# Patient Record
Sex: Female | Born: 1945
Health system: Southern US, Community
[De-identification: ages and names within clinical notes are randomized; demographics above are authoritative.]

## PROBLEM LIST (undated history)

## (undated) DIAGNOSIS — K589 Irritable bowel syndrome without diarrhea: Secondary | ICD-10-CM

## (undated) DIAGNOSIS — K5790 Diverticulosis of intestine, part unspecified, without perforation or abscess without bleeding: Secondary | ICD-10-CM

## (undated) DIAGNOSIS — G5603 Carpal tunnel syndrome, bilateral upper limbs: Secondary | ICD-10-CM

## (undated) DIAGNOSIS — K219 Gastro-esophageal reflux disease without esophagitis: Secondary | ICD-10-CM

## (undated) DIAGNOSIS — K449 Diaphragmatic hernia without obstruction or gangrene: Secondary | ICD-10-CM

## (undated) DIAGNOSIS — K222 Esophageal obstruction: Secondary | ICD-10-CM

## (undated) DIAGNOSIS — B9681 Helicobacter pylori [H. pylori] as the cause of diseases classified elsewhere: Secondary | ICD-10-CM

## (undated) DIAGNOSIS — M5030 Other cervical disc degeneration, unspecified cervical region: Secondary | ICD-10-CM

## (undated) DIAGNOSIS — D179 Benign lipomatous neoplasm, unspecified: Secondary | ICD-10-CM

## (undated) DIAGNOSIS — K2970 Gastritis, unspecified, without bleeding: Secondary | ICD-10-CM

## (undated) DIAGNOSIS — K5710 Diverticulosis of small intestine without perforation or abscess without bleeding: Secondary | ICD-10-CM

## (undated) DIAGNOSIS — K644 Residual hemorrhoidal skin tags: Secondary | ICD-10-CM

## (undated) DIAGNOSIS — E039 Hypothyroidism, unspecified: Secondary | ICD-10-CM

## (undated) DIAGNOSIS — E785 Hyperlipidemia, unspecified: Secondary | ICD-10-CM

## (undated) DIAGNOSIS — N281 Cyst of kidney, acquired: Secondary | ICD-10-CM

## (undated) HISTORY — DX: Residual hemorrhoidal skin tags: K64.4

## (undated) HISTORY — DX: Diaphragmatic hernia without obstruction or gangrene: K44.9

## (undated) HISTORY — DX: Helicobacter pylori (H. pylori) as the cause of diseases classified elsewhere: B96.81

## (undated) HISTORY — PX: OTHER SURGICAL HISTORY: SHX169

## (undated) HISTORY — DX: Hyperlipidemia, unspecified: E78.5

## (undated) HISTORY — PX: DILATION AND CURETTAGE OF UTERUS: SHX78

## (undated) HISTORY — DX: Cyst of kidney, acquired: N28.1

## (undated) HISTORY — DX: Diverticulosis of intestine, part unspecified, without perforation or abscess without bleeding: K57.90

## (undated) HISTORY — DX: Hypothyroidism, unspecified: E03.9

## (undated) HISTORY — PX: NOSE SURGERY: SHX723

## (undated) HISTORY — PX: CARPAL TUNNEL RELEASE: SHX101

## (undated) HISTORY — DX: Irritable bowel syndrome, unspecified: K58.9

## (undated) HISTORY — DX: Esophageal obstruction: K22.2

## (undated) HISTORY — DX: Diverticulosis of small intestine without perforation or abscess without bleeding: K57.10

## (undated) HISTORY — DX: Gastritis, unspecified, without bleeding: K29.70

## (undated) HISTORY — DX: Gastro-esophageal reflux disease without esophagitis: K21.9

---
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
## 1979-11-04 HISTORY — PX: LAPAROSCOPY: SHX197

## 1998-01-29 ENCOUNTER — Other Ambulatory Visit: Admission: RE | Admit: 1998-01-29 | Discharge: 1998-01-29 | Payer: Self-pay | Admitting: Gynecology

## 1998-08-22 ENCOUNTER — Other Ambulatory Visit: Admission: RE | Admit: 1998-08-22 | Discharge: 1998-08-22 | Payer: Self-pay | Admitting: Obstetrics and Gynecology

## 1999-09-13 ENCOUNTER — Other Ambulatory Visit: Admission: RE | Admit: 1999-09-13 | Discharge: 1999-09-13 | Payer: Self-pay | Admitting: Obstetrics and Gynecology

## 1999-11-01 ENCOUNTER — Encounter: Admission: RE | Admit: 1999-11-01 | Discharge: 1999-11-01 | Payer: Self-pay | Admitting: Obstetrics and Gynecology

## 1999-11-01 ENCOUNTER — Encounter: Payer: Self-pay | Admitting: Obstetrics and Gynecology

## 2001-01-11 ENCOUNTER — Other Ambulatory Visit: Admission: RE | Admit: 2001-01-11 | Discharge: 2001-01-11 | Payer: Self-pay | Admitting: Obstetrics and Gynecology

## 2001-07-16 ENCOUNTER — Encounter: Payer: Self-pay | Admitting: Obstetrics and Gynecology

## 2001-07-16 ENCOUNTER — Encounter: Admission: RE | Admit: 2001-07-16 | Discharge: 2001-07-16 | Payer: Self-pay | Admitting: Obstetrics and Gynecology

## 2002-02-17 ENCOUNTER — Other Ambulatory Visit: Admission: RE | Admit: 2002-02-17 | Discharge: 2002-02-17 | Payer: Self-pay | Admitting: Gynecology

## 2002-10-11 ENCOUNTER — Encounter: Admission: RE | Admit: 2002-10-11 | Discharge: 2002-10-11 | Payer: Self-pay | Admitting: Obstetrics and Gynecology

## 2002-10-11 ENCOUNTER — Encounter: Payer: Self-pay | Admitting: Obstetrics and Gynecology

## 2003-05-23 ENCOUNTER — Encounter: Admission: RE | Admit: 2003-05-23 | Discharge: 2003-05-23 | Payer: Self-pay | Admitting: Endocrinology

## 2003-05-23 ENCOUNTER — Encounter: Payer: Self-pay | Admitting: Endocrinology

## 2003-07-14 ENCOUNTER — Other Ambulatory Visit: Admission: RE | Admit: 2003-07-14 | Discharge: 2003-07-14 | Payer: Self-pay | Admitting: Obstetrics and Gynecology

## 2003-08-24 ENCOUNTER — Encounter: Payer: Self-pay | Admitting: Internal Medicine

## 2003-11-04 DIAGNOSIS — K5790 Diverticulosis of intestine, part unspecified, without perforation or abscess without bleeding: Secondary | ICD-10-CM

## 2003-11-04 HISTORY — DX: Diverticulosis of intestine, part unspecified, without perforation or abscess without bleeding: K57.90

## 2003-11-16 ENCOUNTER — Encounter: Admission: RE | Admit: 2003-11-16 | Discharge: 2003-11-16 | Payer: Self-pay | Admitting: Obstetrics and Gynecology

## 2003-11-24 ENCOUNTER — Encounter: Payer: Self-pay | Admitting: Internal Medicine

## 2004-09-11 ENCOUNTER — Other Ambulatory Visit: Admission: RE | Admit: 2004-09-11 | Discharge: 2004-09-11 | Payer: Self-pay | Admitting: Obstetrics and Gynecology

## 2004-10-14 ENCOUNTER — Ambulatory Visit (HOSPITAL_COMMUNITY): Admission: RE | Admit: 2004-10-14 | Discharge: 2004-10-14 | Payer: Self-pay | Admitting: Cardiology

## 2005-01-13 ENCOUNTER — Encounter: Admission: RE | Admit: 2005-01-13 | Discharge: 2005-01-13 | Payer: Self-pay | Admitting: Obstetrics and Gynecology

## 2005-12-26 ENCOUNTER — Other Ambulatory Visit: Admission: RE | Admit: 2005-12-26 | Discharge: 2005-12-26 | Payer: Self-pay | Admitting: Obstetrics & Gynecology

## 2006-01-28 ENCOUNTER — Encounter: Admission: RE | Admit: 2006-01-28 | Discharge: 2006-01-28 | Payer: Self-pay | Admitting: Obstetrics & Gynecology

## 2007-08-12 ENCOUNTER — Encounter: Admission: RE | Admit: 2007-08-12 | Discharge: 2007-08-12 | Payer: Self-pay | Admitting: Obstetrics and Gynecology

## 2007-08-19 ENCOUNTER — Encounter: Admission: RE | Admit: 2007-08-19 | Discharge: 2007-08-19 | Payer: Self-pay | Admitting: Obstetrics and Gynecology

## 2007-10-08 ENCOUNTER — Other Ambulatory Visit: Admission: RE | Admit: 2007-10-08 | Discharge: 2007-10-08 | Payer: Self-pay | Admitting: Obstetrics and Gynecology

## 2008-08-15 ENCOUNTER — Encounter: Admission: RE | Admit: 2008-08-15 | Discharge: 2008-08-15 | Payer: Self-pay | Admitting: Obstetrics and Gynecology

## 2009-09-13 ENCOUNTER — Encounter: Admission: RE | Admit: 2009-09-13 | Discharge: 2009-09-13 | Payer: Self-pay | Admitting: Obstetrics and Gynecology

## 2010-09-02 ENCOUNTER — Encounter (INDEPENDENT_AMBULATORY_CARE_PROVIDER_SITE_OTHER): Payer: Self-pay | Admitting: *Deleted

## 2010-10-03 ENCOUNTER — Encounter: Admission: RE | Admit: 2010-10-03 | Discharge: 2010-10-03 | Payer: Self-pay | Admitting: Obstetrics and Gynecology

## 2010-10-17 ENCOUNTER — Encounter (INDEPENDENT_AMBULATORY_CARE_PROVIDER_SITE_OTHER): Payer: Self-pay

## 2010-10-22 ENCOUNTER — Ambulatory Visit: Payer: Self-pay | Admitting: Internal Medicine

## 2010-10-24 ENCOUNTER — Telehealth (INDEPENDENT_AMBULATORY_CARE_PROVIDER_SITE_OTHER): Payer: Self-pay | Admitting: *Deleted

## 2010-10-24 ENCOUNTER — Encounter: Payer: Self-pay | Admitting: Internal Medicine

## 2010-11-11 ENCOUNTER — Encounter (INDEPENDENT_AMBULATORY_CARE_PROVIDER_SITE_OTHER): Payer: Self-pay

## 2010-11-12 ENCOUNTER — Ambulatory Visit
Admission: RE | Admit: 2010-11-12 | Discharge: 2010-11-12 | Payer: Self-pay | Source: Home / Self Care | Attending: Internal Medicine | Admitting: Internal Medicine

## 2010-11-19 ENCOUNTER — Ambulatory Visit
Admission: RE | Admit: 2010-11-19 | Discharge: 2010-11-19 | Payer: Self-pay | Source: Home / Self Care | Attending: Internal Medicine | Admitting: Internal Medicine

## 2010-11-19 ENCOUNTER — Other Ambulatory Visit: Payer: Self-pay | Admitting: Internal Medicine

## 2010-11-19 LAB — HM COLONOSCOPY

## 2010-11-25 ENCOUNTER — Telehealth (INDEPENDENT_AMBULATORY_CARE_PROVIDER_SITE_OTHER): Payer: Self-pay | Admitting: *Deleted

## 2010-11-25 ENCOUNTER — Encounter: Payer: Self-pay | Admitting: Internal Medicine

## 2010-11-27 ENCOUNTER — Telehealth: Payer: Self-pay | Admitting: Internal Medicine

## 2010-12-03 NOTE — Letter (Signed)
Summary: Pre Visit Letter Revised  [facility]  [address] [place], [place] [address]   Phone: [phone]  Fax: [phone]        09/02/2010 MRN: [id number] Victoria Peters [address] [place], [place]  [address]             Procedure Date:  11/07/2009   Welcome to the Gastroenterology Division at [facility].    You are scheduled to see a nurse for your pre-procedure visit on 10/22/2010 at 8:00AM on the 3rd floor at [facility], [address]  We ask that you try to arrive at our office 15 minutes prior to your appointment time to allow for check-in.  Please take a minute to review the attached form.  If you answer "Yes" to one or more of the questions on the first page, we ask that you call the person listed at your earliest opportunity.  If you answer "No" to all of the questions, please complete the rest of the form and bring it to your appointment.    Your nurse visit will consist of discussing your medical and surgical history, your immediate family medical history, and your medications.   If you are unable to list all of your medications on the form, please bring the medication bottles to your appointment and we will list them.  We will need to be aware of both prescribed and over the counter drugs.  We will need to know exact dosage information as well.    Please be prepared to read and sign documents such as consent forms, a financial agreement, and acknowledgement forms.  If necessary, and with your consent, a friend or relative is welcome to sit-in on the nurse visit with you.  Please bring your insurance card so that we may make a copy of it.  If your insurance requires a referral to see a specialist, please bring your referral form from your primary care physician.  No co-pay is required for this nurse visit.     If you cannot keep your appointment, please call [phone] to cancel or reschedule prior to your appointment date.   This allows [place] the opportunity to schedule an appointment for another patient in need of care.    Thank you for choosing [facility] for your medical needs.  We appreciate the opportunity to care for you.  Please visit [place] at our website  to learn more about our practice.  Sincerely, The Gastroenterology Division

## 2010-12-04 ENCOUNTER — Telehealth: Payer: Self-pay | Admitting: Internal Medicine

## 2010-12-05 NOTE — Miscellaneous (Signed)
Summary: Anusol & Prilosec Rx  Clinical Lists Changes  Medications: Added new medication of ANUSOL-HC 25 MG SUPP (HYDROCORTISONE ACETATE) 1 suppository per rectum at bedtime - Signed Added new medication of PRILOSEC 20 MG CPDR (OMEPRAZOLE) 1 tablet by mouth daily - Signed Rx of ANUSOL-HC 25 MG SUPP (HYDROCORTISONE ACETATE) 1 suppository per rectum at bedtime;  #12 x 1;  Signed;  Entered by: Grier Rocher, RN;  Authorized by: Hart Carwin MD;  Method used: Electronically to [facility] [phone]*, [address] [place], [place], [place]  [address], Ph: [phone], Fax: [phone] Rx of PRILOSEC 20 MG CPDR (OMEPRAZOLE) 1 tablet by mouth daily;  #30 x 3;  Signed;  Entered by: Grier Rocher, RN;  Authorized by: Hart Carwin MD;  Method used: Electronically to [facility] [phone]*, [address], [place], [place], [place]  [address], Ph: [phone], Fax: [phone]    Prescriptions: PRILOSEC 20 MG CPDR (OMEPRAZOLE) 1 tablet by mouth daily  #30 x 3   Entered by:   Grier Rocher, RN   Authorized by:   Hart Carwin MD   Signed by:   Grier Rocher, RN on 11/19/2010   Method used:   Electronically to        [facility] [phone]* (retail)       [address]       [place], [place]  [address]       Ph: [phone]       Fax: [phone]   RxID:   [id number] ANUSOL-HC 25 MG SUPP (HYDROCORTISONE ACETATE) 1 suppository per rectum at bedtime  #12 x 1   Entered by:   Grier Rocher, RN   Authorized by:   Hart Carwin MD   Signed by:   Grier Rocher, RN on 11/19/2010   Method used:   Electronically to        [facility] [phone]* (retail)       [address]       [place], [place]  [address]       Ph: [phone]       Fax: [phone]   RxID:   [id number]

## 2010-12-05 NOTE — Letter (Signed)
Summary: [facility] Instructions  [facility]  [address] [place], [place] [address]   Phone: [phone]  Fax: [phone]       Victoria Peters    [birth year]    MRN: [id number]        Procedure Day /Date:  Tuesday 11/19/2010     Arrival Time:  2:30 pm     Procedure Time:  3:30 pm     Location of Procedure:                    _x _  [facility] (4th Floor)                        PREPARATION FOR COLONOSCOPY WITH MOVIPREP   Starting 5 days prior to your procedure Thursday 1/12 do not eat nuts, seeds, popcorn, corn, beans, peas,  salads, or any raw vegetables.  Do not take any fiber supplements (e.g. Metamucil, Citrucel, and Benefiber).  THE DAY BEFORE YOUR PROCEDURE         DATE: Monday 1/16  1.  Drink clear liquids the entire day-NO SOLID FOOD  2.  Do not drink anything colored red or purple.  Avoid juices with pulp.  No orange juice.  3.  Drink at least 64 oz. (8 glasses) of fluid/clear liquids during the day to prevent dehydration and help the prep work efficiently.  CLEAR LIQUIDS INCLUDE: Water Jello Ice Popsicles Tea (sugar ok, no milk/cream) Powdered fruit flavored drinks Coffee (sugar ok, no milk/cream) Gatorade Juice: apple, white grape, white cranberry  Lemonade Clear bullion, consomm, broth Carbonated beverages (any kind) Strained chicken noodle soup Hard Candy                             4.  In the morning, mix first dose of MoviPrep solution:    Empty 1 Pouch A and 1 Pouch B into the disposable container    Add lukewarm drinking water to the top line of the container. Mix to dissolve    Refrigerate (mixed solution should be used within 24 hrs)  5.  Begin drinking the prep at 5:00 p.m. The MoviPrep container is divided by 4 marks.   Every 15 minutes drink the solution down to the next mark (approximately 8 oz) until the full liter is complete.   6.  Follow completed prep with 16 oz of clear liquid of your choice  (Nothing red or purple).  Continue to drink clear liquids until bedtime.  7.  Before going to bed, mix second dose of MoviPrep solution:    Empty 1 Pouch A and 1 Pouch B into the disposable container    Add lukewarm drinking water to the top line of the container. Mix to dissolve    Refrigerate  THE DAY OF YOUR PROCEDURE      DATE: Tuesday 1/7  Beginning at 10:30 a.m. (5 hours before procedure):         1. Every 15 minutes, drink the solution down to the next mark (approx 8 oz) until the full liter is complete.  2. Follow completed prep with 16 oz. of clear liquid of your choice.    3. You may drink clear liquids until 1:30 pm (2 HOURS BEFORE PROCEDURE).   MEDICATION INSTRUCTIONS  Unless otherwise instructed, you should take regular prescription medications with a small sip of water   as early as possible the morning  of your procedure.         OTHER INSTRUCTIONS  You will need a responsible adult at least 66 years of age to accompany you and drive you home.   This person must remain in the waiting room during your procedure.  Wear loose fitting clothing that is easily removed.  Leave jewelry and other valuables at home.  However, you may wish to bring a book to read or  an iPod/MP3 player to listen to music as you wait for your procedure to start.  Remove all body piercing jewelry and leave at home.  Total time from sign-in until discharge is approximately 2-3 hours.  You should go home directly after your procedure and rest.  You can resume normal activities the  day after your procedure.  The day of your procedure you should not:   Drive   Make legal decisions   Operate machinery   Drink alcohol   Return to work  You will receive specific instructions about eating, activities and medications before you leave.    The above instructions have been reviewed and explained to me by   Ulis Rias RN  November 12, 2010 4:22 PM     I fully understand and  can verbalize these instructions _____________________________ Date _________

## 2010-12-05 NOTE — Procedures (Signed)
Summary: Colonoscopy   Patient Name: Victoria Peters, Victoria Peters MRN:  Procedure Procedures: Colonoscopy CPT: [phone].    with biopsy. CPT: Q5068410.  Personnel: Endoscopist: Dora L. Juanda Chance, MD.  Referred By: Meredeth Ide, MD.  Exam Location: Exam performed in Outpatient Clinic. Outpatient  Patient Consent: Procedure, Alternatives, Risks and Benefits discussed, consent obtained, from patient. Consent was obtained by the RN.  Indications Symptoms: Constipation Patient's stools are infrequent.  Increased Risk Screening: For family history of colorectal neoplasia, in  grandparent Family History of Polyps.  History  Current Medications: Patient is not currently taking Coumadin.  Pre-Exam Physical: Performed Nov 24, 2003. Cardio-pulmonary exam, Rectal exam, HEENT exam , Abdominal exam, Extremity exam, Neurological exam, Mental status exam WNL.  Exam Exam: Extent of exam reached: Cecum, extent intended: Cecum.  The cecum was identified by appendiceal orifice and IC valve. Images taken. ASA Classification: I. Tolerance: good.  Monitoring: Pulse and BP monitoring, Oximetry used. Supplemental O2 given.  Colon Prep Used Golytely for colon prep. Prep results: good.  Sedation Meds: Patient assessed and found to be appropriate for moderate (conscious) sedation. Fentanyl 50 mcg. given IV. Versed 5 mg. given IV.  Findings POLYP: Cecum, diminutive, sessile polyp. Distance from Anus 110 cm. Procedure:  hot biopsy, removed, retrieved, Polyp sent to pathology. ICD9: Colon Polyps: 211.3.  - DIVERTICULOSIS: Descending Colon to Sigmoid Colon. ICD9: Diverticulosis: 562.10. Comments: mild.   Assessment Abnormal examination, see findings above.  Diagnoses: 211.3: Colon Polyps.  562.10: Diverticulosis.   Comments: tiny polyp removed Events  Unplanned Interventions: No intervention was required.  Unplanned Events: There were no complications. Plans Medication Plan: Await pathology. Flax  seed: laxative OTC 1 prn, starting Nov 24, 2003   Patient Education: Patient given standard instructions for: Yearly hemoccult testing recommended. Patient instructed to get routine colonoscopy every 7 years.  Disposition: After procedure patient sent to recovery. After recovery patient sent home.    cc:  Meredeth Ide, MD      Adela Lank, MD  This report was created from the original endoscopy report, which was reviewed and signed by the above listed endoscopist.

## 2010-12-05 NOTE — Miscellaneous (Signed)
Summary: Lec previsit  Clinical Lists Changes  Medications: Added new medication of MOVIPREP 100 GM  SOLR (PEG-KCL-NACL-NASULF-NA ASC-C) As per prep instructions. - Signed Rx of MOVIPREP 100 GM  SOLR (PEG-KCL-NACL-NASULF-NA ASC-C) As per prep instructions.;  #1 x 0;  Signed;  Entered by: Ulis Rias RN;  Authorized by: Hart Carwin MD;  Method used: Electronically to [facility] [phone]*, [address] [place], [place], [place]  [address], Ph: [phone], Fax: [phone] Observations: Added new observation of ALLERGY REV: Done (11/12/2010 15:56)    Prescriptions: MOVIPREP 100 GM  SOLR (PEG-KCL-NACL-NASULF-NA ASC-C) As per prep instructions.  #1 x 0   Entered by:   Ulis Rias RN   Authorized by:   Hart Carwin MD   Signed by:   Ulis Rias RN on 11/12/2010   Method used:   Electronically to        [facility] [phone]* (retail)       [address]       [place], [place]  [address]       Ph: [phone]       Fax: [phone]   RxID:   [id number]

## 2010-12-05 NOTE — Procedures (Addendum)
Summary: Colonoscopy  Patient: [name] Note: All result statuses are Final unless otherwise noted.  Tests: (1) Colonoscopy (COL)   COL Colonoscopy           DONE     [facility]     [address]     [place], [place]  [address]           COLONOSCOPY PROCEDURE REPORT           PATIENT:  Victoria, Peters  MR#:  [id number]     BIRTHDATE:  24-Jun-1946, 64 yrs. old  GENDER:  female     ENDOSCOPIST:  Hedwig Morton. Juanda Chance, MD     REF. BY:  Adela Lank, M.D.     PROCEDURE DATE:  11/19/2010     PROCEDURE:  Colonoscopy 13086     ASA CLASS:  Class II     INDICATIONS:  family history of colon cancer, history of polyps     last colon 2005     MEDICATIONS:   There was residual sedation effect present from     prior procedure. 4 mg, Fentanyl 50 mcg           DESCRIPTION OF PROCEDURE:   After the risks benefits and     alternatives of the procedure were thoroughly explained, informed     consent was obtained.  Digital rectal exam was performed and     revealed no rectal masses.   The [id number] endoscope was     introduced through the anus and advanced to the cecum, which was     identified by both the appendix and ileocecal valve, without     limitations.  The quality of the prep was good, using MoviPrep.     The instrument was then slowly withdrawn as the colon was fully     examined.     <<PROCEDUREIMAGES>>           FINDINGS:  Internal and external hemorrhoids were found (see     image10, image9, and image7).  Mild diverticulosis was found (see     image5).  This was otherwise a normal examination of the colon     (see image3, image2, and image1).   Retroflexed views in the     rectum revealed no abnormalities.    The scope was then withdrawn     from the patient and the procedure completed.           COMPLICATIONS:  None     ENDOSCOPIC IMPRESSION:     1) Internal and external hemorrhoids     2) Mild diverticulosis     3) Otherwise normal examination  RECOMMENDATIONS:     1) high fiber diet     AnusolHC supp 1 qhs, 1 refill, #12/box     REPEAT EXAM:  In 10 year(s) for.           ______________________________     Hedwig Morton. Juanda Chance, MD           CC:  Adela Lank, M.D.           n.     eSIGNED:   Hedwig Morton. Ashton Belote at 11/19/2010 04:38 PM           [place], [place], [id number]  Note: An exclamation mark (!) indicates a result that was not dispersed into the flowsheet. Document Creation Date: 11/19/2010 4:39 PM _______________________________________________________________________  (1) Order result status: Final Collection or observation date-time: 11/19/2010 16:23 Requested date-time:  Receipt date-time:  Reported date-time:  Referring Physician:   Ordering Physician: [name] [phone]) Specimen Source:  Source: Launa Grill Order Number: [id number] Lab site:   Appended Document: Colonoscopy    Clinical Lists Changes  Observations: Added new observation of COLONNXTDUE: 11/2020 (11/19/2010 17:11)

## 2010-12-05 NOTE — Letter (Signed)
Summary: Moviprep Instructions  [facility]  [address]   [place], [place] [address]   Phone: [phone]  Fax: [phone]       Victoria Peters    [birth year]    MRN: [id number]        Procedure Day Dorna Bloom: Thursday, [birth year]     Arrival Time: 1:00 p.m.     Procedure Time: 2:00 p.m.     Location of Procedure:                    x   [facility] (4th Floor)  PREPARATION FOR COLONOSCOPY WITH MOVIPREP   Starting 5 days prior to your procedure 11-02-10 do not eat nuts, seeds, popcorn, corn, beans, peas,  salads, or any raw vegetables.  Do not take any fiber supplements (e.g. Metamucil, Citrucel, and Benefiber).  THE DAY BEFORE YOUR PROCEDURE         DATE: 11-06-10  DAY: Wednesday  1.  Drink clear liquids the entire day-NO SOLID FOOD  2.  Do not drink anything colored red or purple.  Avoid juices with pulp.  No orange juice.  3.  Drink at least 64 oz. (8 glasses) of fluid/clear liquids during the day to prevent dehydration and help the prep work efficiently.  CLEAR LIQUIDS INCLUDE: Water Jello Ice Popsicles Tea (sugar ok, no milk/cream) Powdered fruit flavored drinks Coffee (sugar ok, no milk/cream) Gatorade Juice: apple, white grape, white cranberry  Lemonade Clear bullion, consomm, broth Carbonated beverages (any kind) Strained chicken noodle soup Hard Candy                             4.  In the morning, mix first dose of MoviPrep solution:    Empty 1 Pouch A and 1 Pouch B into the disposable container    Add lukewarm drinking water to the top line of the container. Mix to dissolve    Refrigerate (mixed solution should be used within 24 hrs)  5.  Begin drinking the prep at 5:00 p.m. The MoviPrep container is divided by 4 marks.   Every 15 minutes drink the solution down to the next mark (approximately 8 oz) until the full liter is complete.   6.  Follow completed prep with 16 oz of clear liquid of your choice (Nothing red or purple).   Continue to drink clear liquids until bedtime.  7.  Before going to bed, mix second dose of MoviPrep solution:    Empty 1 Pouch A and 1 Pouch B into the disposable container    Add lukewarm drinking water to the top line of the container. Mix to dissolve    Refrigerate  THE DAY OF YOUR PROCEDURE      DATE: 11-07-10  DAY: Thursday  Beginning at 9:00 a.m. (5 hours before procedure):         1. Every 15 minutes, drink the solution down to the next mark (approx 8 oz) until the full liter is complete.  2. Follow completed prep with 16 oz. of clear liquid of your choice.    3. You may drink clear liquids until 12:00 p.m. (2 HOURS BEFORE PROCEDURE).   MEDICATION INSTRUCTIONS  Unless otherwise instructed, you should take regular prescription medications with a small sip of water   as early as possible the morning of your procedure.       OTHER INSTRUCTIONS  You will need a responsible adult at least 66 years  of age to accompany you and drive you home.   This person must remain in the waiting room during your procedure.  Wear loose fitting clothing that is easily removed.  Leave jewelry and other valuables at home.  However, you may wish to bring a book to read or  an iPod/MP3 player to listen to music as you wait for your procedure to start.  Remove all body piercing jewelry and leave at home.  Total time from sign-in until discharge is approximately 2-3 hours.  You should go home directly after your procedure and rest.  You can resume normal activities the  day after your procedure.  The day of your procedure you should not:   Drive   Make legal decisions   Operate machinery   Drink alcohol   Return to work  You will receive specific instructions about eating, activities and medications before you leave.    The above instructions have been reviewed and explained to me by   Ulis Rias RN  October 22, 2010 8:16 AM     I fully understand and can verbalize  these instructions _____________________________ Date _________

## 2010-12-05 NOTE — Miscellaneous (Signed)
Summary: REC COL...AS.  Clinical Lists Changes  Medications: Added new medication of MOVIPREP 100 GM  SOLR (PEG-KCL-NACL-NASULF-NA ASC-C) As per prep instructions. - Signed Rx of MOVIPREP 100 GM  SOLR (PEG-KCL-NACL-NASULF-NA ASC-C) As per prep instructions.;  #1 x 0;  Signed;  Entered by: Ulis Rias RN;  Authorized by: Hart Carwin MD;  Method used: Electronically to [facility] [phone]*, [address], [place], [place], [place]  [address], Ph: [phone], Fax: [phone] Observations: Added new observation of NKA: T (10/22/2010 7:50)    Prescriptions: MOVIPREP 100 GM  SOLR (PEG-KCL-NACL-NASULF-NA ASC-C) As per prep instructions.  #1 x 0   Entered by:   Ulis Rias RN   Authorized by:   Hart Carwin MD   Signed by:   Ulis Rias RN on 10/22/2010   Method used:   Electronically to        [facility] [phone]* (retail)       [address]       [place], [place]  [address]       Ph: [phone]       Fax: [phone]   RxID:   [id number]   Appended Document: REC COL...AS. Dr Juanda Chance, The patient came in today for her previsit prior to her colonoscopy scheduled for 11/07/10 at 2pm. She has a history of a hiatal hernia and reflux and wanted to know if she needed a endoscopy at the same time. Her last endoscopy was 11/24/2003 and results were positive for H- Pylori. Please advise.  Appended Document: REC COL...AS. OK to schedule for EGD pending review of her chart. Please preload her paper chart into EMR.

## 2010-12-05 NOTE — Procedures (Signed)
Summary: EGD   Patient Name: Victoria Peters, Victoria Peters MRN:  Procedure Procedures: Panendoscopy (EGD) CPT: 43235.    with biopsy(s)/brushing(s). CPT: D1846139.    with esophageal dilation. CPT: G9296129.  Personnel: Endoscopist: Dora L. Juanda Chance, MD.  Referred By: Meredeth Ide, MD.  Exam Location: Exam performed in Outpatient Clinic. Outpatient  Patient Consent: Procedure, Alternatives, Risks and Benefits discussed, consent obtained, from patient. Consent was obtained by the RN.  Indications Symptoms: Dyspepsia, location: epigastric. Reflux symptoms  History  Current Medications: Patient is not currently taking Coumadin.  Pre-Exam Physical: Performed Nov 24, 2003  Cardio-pulmonary exam, HEENT exam, Abdominal exam, Extremity exam, Neurological exam, Mental status exam WNL.  Exam Exam Info: Maximum depth of insertion Duodenum, intended Duodenum. Vocal cords visualized. Gastric retroflexion performed. Images taken. ASA Classification: I. Tolerance: good.  Sedation Meds: Patient assessed and found to be appropriate for moderate (conscious) sedation. Fentanyl 25 mcg. given IV. Versed 2 mg. given IV. Cetacaine Spray 2 sprays given aerosolized.  Monitoring: BP and pulse monitoring done. Oximetry used. Supplemental O2 given  Findings - OTHER FINDING: mild narrow ing of the g-e junction in Distal Esophagus. Biopsy/Other Finding taken.  Dilation: Distal Esophagus. Maloney dilator used, Diameter: 48 mm, Minimal Resistance, Minimal Heme present on extraction. 1  total dilators used. Patient tolerance fair. Outcome: successful.  - MUCOSAL ABNORMALITY: Body. Erosions present. Hemorrhage (oozing). Erythematous mucosa. RUT done, results pending. ICD9: Gastritis, Acute: 535.00. Comment: bike reflux.   Assessment Abnormal examination, see findings above.  Diagnoses: 535.00: Gastritis, Acute.   Comments: s/p passage of 97F Maloney dilator  thru a narow g-e junction [no definite  stricture] Events  Unplanned Intervention: No unplanned interventions were required.  Unplanned Events: There were no complications. Plans Medication(s): Await pathology. Promotility: Metaclopramide 10 mg HS, starting Nov 24, 2003  H2Blocker: Pepcid/Famotidine 40 mg prn, starting Nov 24, 2003   Patient Education: Patient given standard instructions for: Reflux.  Comments: colonoscopy Disposition: After procedure patient sent to recovery. After recovery patient sent home.    cc: Meredeth Ide, MD     Marny Lowenstein, MD  This report was created from the original endoscopy report, which was reviewed and signed by the above listed endoscopist.

## 2010-12-05 NOTE — Letter (Signed)
Summary: Patient [facility] Biopsy Results  [facility]  [address] [place], [place] [address]   Phone: [phone]  Fax: [phone]        November 25, 2010 MRN: [id number]    [facility] [address] [place], [place]  [address]    Dear Ms. Saephanh,  I am pleased to inform you that the biopsies taken during your recent endoscopic examination did not show any evidence of cancer upon pathologic examination.The biopsies show H.Pylori gastritis, which needs to be tyreated with antibiotics.  Additional information/recommendations:  __No further action is needed at this time.  Please follow-up with      your primary care physician for your other healthcare needs.  MY OFFICE WILL CALL YOU IN YOUR MEDICATIONS FOR H.PYLORI. PLEASE PICK THEM UP.  __ Continue with the treatment plan as outlined on the day of your      exam.    Please call us if you are having persistent problems or have questions about your condition that have not been fully answered at this time.  Sincerely,  Hart Carwin MD  This letter has been electronically signed by your physician.  Appended Document: Patient Notice-Endo Biopsy Results LETTER MAILED

## 2010-12-05 NOTE — Procedures (Addendum)
Summary: Upper Endoscopy  Patient: Tanaka Gillen Note: All result statuses are Final unless otherwise noted.  Tests: (1) Upper Endoscopy (EGD)   EGD Upper Endoscopy       DONE (C)     [facility]     [address]     [place], [place]  [address]           ENDOSCOPY PROCEDURE REPORT           PATIENT:  Brooke, Steinhilber  MR#:  [id number]     BIRTHDATE:  02/22/1946, 64 yrs. old  GENDER:  female           ENDOSCOPIST:  Hedwig Morton. Juanda Chance, MD     Referred by:  Adela Lank, M.D.           PROCEDURE DATE:  11/19/2010     PROCEDURE:  EGD with biopsy, [address]     ASA CLASS:  Class II     INDICATIONS:  abdominal pain hx of H.Pylori, in 11/2003, treated     but pt does not recall being treated           MEDICATIONS:   Fentanyl 50 mcg, Versed 5 mg IV     TOPICAL ANESTHETIC:  Exactacain Spray           DESCRIPTION OF PROCEDURE:   After the risks benefits and     alternatives of the procedure were thoroughly explained, informed     consent was obtained.  The LB GIF-H180 [id number] endoscope was     introduced through the mouth and advanced to the second portion of     the duodenum, without limitations.  The instrument was slowly     withdrawn as the mucosa was fully examined.     <<PROCEDUREIMAGES>>           Esophagitis was found in the distal esophagus. several <5 mm     erosions With standard forceps, a biopsy was obtained and sent to     pathology.  A hiatal hernia was found (see image1, image3, image8,     and image11). 2 cm hh  Bile reflux was found (see image5).  Mild     gastritis was found. With standard forceps, a biopsy was obtained     and sent to pathology. r/o H (see image4, image7, image9, and     image10).pylori  Otherwise normal stomach (see image6).     Retroflexed views revealed no abnormalities.    The scope was then     withdrawn from the patient and the procedure completed.           COMPLICATIONS:  None           ENDOSCOPIC IMPRESSION:     1) Esophagitis in  the distal esophagus     2) Hiatal hernia     3) Bile reflux     4) Mild gastritis     5) Otherwise normal stomach     RECOMMENDATIONS:     1) Await biopsy results     Prelosec 20 mg, #30, 1 po qd, 3 refills           REPEAT EXAM:  In 0 year(s) for.           ______________________________     Hedwig Morton. Juanda Chance, MD           CC:  Adela Lank, M.D.           n.     REVISED:  11/28/2010 08:27 AM     eSIGNED:   Hedwig Morton. Tamyra Fojtik at 11/28/2010 08:27 AM           Erlene, Devita, [id number]  Note: An exclamation mark (!) indicates a result that was not dispersed into the flowsheet. Document Creation Date: 11/28/2010 8:27 AM _______________________________________________________________________  (1) Order result status: Final Collection or observation date-time: 11/19/2010 16:09 Requested date-time:  Receipt date-time:  Reported date-time:  Referring Physician:   Ordering Physician: [name] [phone]) Specimen Source:  Source: Launa Grill Order Number: [id number] Lab site:

## 2010-12-05 NOTE — Letter (Signed)
Summary: Pre Visit Letter Revised  [facility]  [address] [place], [place] [address]   Phone: [phone]  Fax: [phone]        10/24/2010 MRN: [id number] [facility] [address] [place], [place]  [address]             Procedure Date: 11/19/2010  Welcome to the Gastroenterology Division at [facility].    You are scheduled to see a nurse for your pre-procedure visit on 11/12/2010 at 4 PM on the 3rd floor at [facility], [address]  We ask that you try to arrive at our office 15 minutes prior to your appointment time to allow for check-in.  Please take a minute to review the attached form.  If you answer "Yes" to one or more of the questions on the first page, we ask that you call the person listed at your earliest opportunity.  If you answer "No" to all of the questions, please complete the rest of the form and bring it to your appointment.    Your nurse visit will consist of discussing your medical and surgical history, your immediate family medical history, and your medications.   If you are unable to list all of your medications on the form, please bring the medication bottles to your appointment and we will list them.  We will need to be aware of both prescribed and over the counter drugs.  We will need to know exact dosage information as well.    Please be prepared to read and sign documents such as consent forms, a financial agreement, and acknowledgement forms.  If necessary, and with your consent, a friend or relative is welcome to sit-in on the nurse visit with you.  Please bring your insurance card so that we may make a copy of it.  If your insurance requires a referral to see a specialist, please bring your referral form from your primary care physician.  No co-pay is required for this nurse visit.     If you cannot keep your appointment, please call [phone] to cancel or reschedule prior to your appointment date.  This  allows [place] the opportunity to schedule an appointment for another patient in need of care.    Thank you for choosing [facility] for your medical needs.  We appreciate the opportunity to care for you.  Please visit [place] at our website  to learn more about our practice.  Sincerely, The Gastroenterology Division

## 2010-12-05 NOTE — Progress Notes (Signed)
Summary: Change from Prevpak to another Medication  Phone Note Outgoing Call   Summary of Call: ---- 11/27/2010 10:48 AM, Lamona Curl CMA (AAMA) wrote: insurance will not cover prevpak....would you like me to send amoxicillin 1000 mg two times a day, clarithromycin 500 two times a day and ppi bid x 10 days instead?  ---- 11/27/2010 11:09 AM, Hart Carwin MD wrote: yes, please, thank You.  Follow-up for Phone Call        I have left a message for the patient to call back. Dottie Nelson-Smith CMA Duncan Dull)  November 27, 2010 11:18 AM   I have spoken with patient and advised her that Juanda Crumble is not covered by her insurance. Advised her we have sent Biaxin and Amoxicillin in its place. I have confirmed that she is not allergic to any of these medications. I have given her specific instructions as to how to take medication and have asked her to double up on her prilosec (take two times a day dosing) x 10 days while she is taking the antibiotics. I have advised her that she should let us know if she cannot tolerate the medication or if her symptoms persist after she has taken the course. She verabalizes understanding. Follow-up by: Lamona Curl CMA Duncan Dull),  November 28, 2010 8:34 AM    New/Updated Medications: AMOXICILLIN 500 MG CAPS (AMOXICILLIN) Take 2 capsules by mouth two times a day x 10 days (PLEASE D/C PREVPAK RX) BIAXIN 500 MG TABS (CLARITHROMYCIN) Take 1 tablet by mouth two times a day x 10 days (PLEASE D/C PREVPAK RX) Prescriptions: BIAXIN 500 MG TABS (CLARITHROMYCIN) Take 1 tablet by mouth two times a day x 10 days (PLEASE D/C PREVPAK RX)  #20 x 0   Entered by:   Lamona Curl CMA (AAMA)   Authorized by:   Hart Carwin MD   Signed by:   Lamona Curl CMA (AAMA) on 11/27/2010   Method used:   Electronically to        [facility] [phone]* (retail)       [address]       [place], [place]  [address]       Ph: [phone]      Fax: [phone]   RxID:   [id number] AMOXICILLIN 500 MG CAPS (AMOXICILLIN) Take 2 capsules by mouth two times a day x 10 days (PLEASE D/C PREVPAK RX)  #40 x 0   Entered by:   Lamona Curl CMA (AAMA)   Authorized by:   Hart Carwin MD   Signed by:   Lamona Curl CMA (AAMA) on 11/27/2010   Method used:   Electronically to        [facility] [phone]* (retail)       [address]       [place], [place]  [address]       Ph: [phone]       Fax: [phone]   RxID:   [id number]

## 2010-12-05 NOTE — Progress Notes (Signed)
----   Converted from flag ---- ---- 11/25/2010 12:50 PM, Hart Carwin MD wrote: Victoria Peters, Mrs Demby has H.Pylori gastritis. Please send her Prevpak.Thanx ------------------------------       Additional Follow-up for Phone Call Additional follow up Details #2::    Lmom for patient to call back- want to verify her pharmacy. Graciella Freer RN  November 25, 2010 2:57 PM  Message left for patient to call back again. Jesse Fall RN  November 26, 2010 9:51 AM Explained to patient H. pylori dx and need for medications. Verified pharmacy and sent rx to her pharmacy. Follow-up by: Jesse Fall RN,  November 26, 2010 4:17 PM  New/Updated Medications: PREVPAC  MISC (AMOXICILL-CLARITHRO-LANSOPRAZ) As directed Prescriptions: PREVPAC  MISC (AMOXICILL-CLARITHRO-LANSOPRAZ) As directed  #1 pack x 0   Entered by:   Jesse Fall RN   Authorized by:   Hart Carwin MD   Signed by:   Jesse Fall RN on 11/26/2010   Method used:   Electronically to        [facility] [phone]* (retail)       [address]       [place], [place]  [address]       Ph: [phone]       Fax: [phone]   RxID:   [id number]

## 2010-12-05 NOTE — Progress Notes (Signed)
Summary: Schedule ECL.  ---- Converted from flag ---- ---- 10/23/2010 10:13 PM, Hart Carwin MD wrote: Is this the pt  who asked if EGD  needed at the time of colonoscopy on 11/07/2010? Only if she is having any symptoms. If not, then she does not need an EGD.  ---- 10/23/2010 11:39 AM, Jesse Fall RN wrote: Paper chart in your office. Regina ------------------------------  Spoke with patient. She is having reflux and esophageal burning. She is taking OTC Equate Acid Reducer without relief. Rescheduled patient for 11/19/2010 for ECL.  Phone Note Outgoing Call Call back at [facility] Phone [phone]   Call placed by: Jesse Fall RN,  October 24, 2010 1:11 PM

## 2010-12-05 NOTE — Progress Notes (Signed)
----   Converted from flag ---- ---- 11/25/2010 12:50 PM, Hart Carwin MD wrote: Dagoberto Reef, Mrs Fabio has H.Pylori gastritis. Please send her Prevpak.Thanx ------------------------------

## 2010-12-06 ENCOUNTER — Telehealth: Payer: Self-pay | Admitting: Internal Medicine

## 2010-12-11 NOTE — Progress Notes (Signed)
Summary: triage  Phone Note Call from Patient Call back at [phone]   Caller: Patient Call For: dR. Rasheda Ledger Reason for Call: Talk to Nurse Summary of Call: Pt was put on medication by Dr. Juanda Chance that she thinks she is having an allergic reaction to and would like to speak with nurse Initial call taken by: [place] Johnson,  December 04, 2010 8:19 AM  Follow-up for Phone Call        Spoke with patient. She started Biaxin 500 mg two times a day, Amoxicillin 500 mg two times a day and Prilosec two times a day for 10 days for H. Pylori on 11/28/10. Last night, her stomach started hurting and at 3 AM she woke up sweating, feeling faint, stomach bloated, nausea and diarrhea. Now, she has nausea and her stomach feels bloated. Denies diarrhea or fever at this time. She does not think she can take the medication and not vomit. She is not sure if she is having a reaction from the medication or if she has a stomach virus. Please, advise. Follow-up by: Jesse Fall RN,  December 04, 2010 9:00 AM  Additional Follow-up for Phone Call Additional follow up Details #1::        Could be infection or more likely side effects from Biaxain or less likely Amoxicillin. She needs to stop them for now and then will need a  call back next week  to see how she is and eventually Dr. Juanda Chance will need to decide how to treat the H. pylori Liquid to BRAT diet for now, advancing as tolerated Additional Follow-up by: Iva Boop MD, Clementeen Graham,  December 04, 2010 12:00 PM    Additional Follow-up for Phone Call Additional follow up Details #2::    Patient notified of Dr. Marvell Fuller recommendations.  She will call back is she does not get to feeling better.Flag sent to me to check on patient next week. Follow-up by: Jesse Fall RN,  December 04, 2010 1:25 PM  Additional Follow-up for Phone Call Additional follow up Details #3:: Details for Additional Follow-up Action Taken: reviwed and agree with Dr Leone Payor. I will discuss  when I return. Additional Follow-up by: Hart Carwin MD,  December 04, 2010 3:04 PM

## 2010-12-16 ENCOUNTER — Ambulatory Visit (INDEPENDENT_AMBULATORY_CARE_PROVIDER_SITE_OTHER): Payer: Medicare Other | Admitting: Internal Medicine

## 2010-12-16 ENCOUNTER — Encounter: Payer: Self-pay | Admitting: Internal Medicine

## 2010-12-16 DIAGNOSIS — K2900 Acute gastritis without bleeding: Secondary | ICD-10-CM

## 2010-12-19 NOTE — Progress Notes (Signed)
Summary: Triage  Phone Note Call from Patient Call back at [phone]   Caller: Patient Call For: Dr. Juanda Chance Reason for Call: Talk to Nurse Summary of Call: Patient would like to follow up with nurse Initial call taken by: [place] Johnson,  December 06, 2010 4:31 PM  Follow-up for Phone Call        Spoke with patient. She is feeling bloated after eating a little bit and has some nausea. She states she thinks it is because she is not having good bowel movements and wants to know what to take. Suggested she try Miralax one dose tonight to see if this will help. Follow-up by: Jesse Fall RN,  December 06, 2010 4:49 PM  Additional Follow-up for Phone Call Additional follow up Details #1::        I agree, may continue 1/2 capfull daily. Additional Follow-up by: Hart Carwin MD,  December 06, 2010 9:42 PM    Additional Follow-up for Phone Call Additional follow up Details #2::    Patient given Dr. Regino Schultze recommendation. She states she is feeling much better today. She is wondering what to do now about  H. pylori treatment. Please, advise. Follow-up by: Jesse Fall RN,  December 09, 2010 9:42 AM  Additional Follow-up for Phone Call Additional follow up Details #3:: Details for Additional Follow-up Action Taken: If she finished it, then nothing has to be done. If more issues, make appointment Additional Follow-up by: Hart Carwin MD,  December 09, 2010 10:26 PM   Appended Document: Triage Scheduled patient an appointment on 12/16/10 at 10:45 AM with Dr. Juanda Chance. Message left for patient to call back. Jesse Fall, RN 12/10/10 10:43 AM  Appended Document: Triage Patient called back. Gave her the appointment date and time.

## 2010-12-19 NOTE — Assessment & Plan Note (Signed)
Summary: Gastroenterology  [place]  MR#:  [id number] Page #  NAME:  Victoria Peters, Victoria Peters   OFFICE NO:  [id number]  DATE:  08/24/03  DOB:  July 29, 2046  NEW PATIENT EVALUATION   The patient is a very nice 65 year old white female patient of Dr. Harlene Salts who is referred for a screening colonoscopy.  She has a history of a hiatal hernia and gastroesophageal reflux, which is currently well controlled on metoclopramide 10 mg at bedtime.  He has not taken any acid-suppressing agents.  She denies any dysphagia.  Her lower gastrointestinal symptoms are characterized by constipation.  She has tried various over-the-counter preparations, mostly simply flax seed she takes in granular form on a daily basis.  She now is having regular daily bowel movements.  She denies rectal bleeding.  There is a question of a family history of colon cancer in her mother, diagnosis was never confirmed.  Her brother, at the age of 32, had colon polyps and had colonoscopy.  She had a flexible sigmoidoscopy in April of 1990 by me which was a normal exam.  She has never had a colonoscopy.    ALLERGIES:  None.    MEDICATIONS:  Lescol XL 80 mg p.o. daily, metoclopramide 10 mg p.o. nightly, Synthroid 0.075 mg p.o. daily.  PAST ILLNESSES:  Hyperlipidemia, thyroid trouble.  OPERATIONS:  Laparoscopy, no surgery.    FAMILY HISTORY:  Positive for heart disease in brother and colon cancer as in the present illness.    SOCIAL HISTORY:  She does not smoke and does not drink.  She stopped smoking 34 years ago.  She is married and has children.    REVIEW OF SYSTEMS:  Positive of allergies and arthritic complaints.    PHYSICAL EXAMINATION:  Blood pressure 132/74, pulse 72, and weight 142 pounds.  She was alert, oriented, and in no distress.  Skin was warm and dry.   Sclerae were nonicteric.  Neck was supple with no adenopathy.  Lungs were clear to auscultation.  COR: With normal S1 and normal S2.  Abdomen was soft and nontender with normoactive bowel  sounds.  No particular tenderness.  No distention.  Rectal exam:  Hemoccult-negative stool.     IMPRESSION:   66.  A 65 year old white female with gastroesophageal reflux disease which is controlled on metoclopramide.  She denies any symptoms suggestive of esophageal stricture.   2.  Positive family history of colon polyps with question of family history of colon cancer.  The patient is a good candidate for screening colonoscopy.    PLAN:   Screening colonoscopy has been scheduled for October 06, 2003, at 10:00 a.m. in our office.  She also has requested upper endoscopy because of chronic gastroesophageal reflux.  I agree because she is dependent on medications to control her reflux.  We need to rule out Barrett's esophagus.          Hedwig Morton. Juanda Chance, M.D.  [id number] cc:  Dr. Tomasa Hose  D:  08/24/03; T:  ; Job [phone]

## 2010-12-25 NOTE — Assessment & Plan Note (Signed)
Summary: F/u appointment    History of Present Illness Visit Type: Follow-up Visit Primary GI MD: [name] MD Primary Provider: Michiel Sites, MD  Requesting Provider: na Chief Complaint: F/u from bloating and nausea. Pt states nausea is much better and bloating is getting better  History of Present Illness:   This is a 65 year old white female s/p  recent upper endoscopy and colonoscopy with findings of internal hemorrhoids and H. pylori gastritis. She was unable to tolerate the H. pylori regimen of Amoxicillin and Biaxin and took only 6 days worth.  She is feeling very well, having no symptoms. She has a history of gastroesophageal reflux controlled on Prilosec 20 mg daily.   GI Review of Systems      Denies abdominal pain, acid reflux, belching, bloating, chest pain, dysphagia with liquids, dysphagia with solids, heartburn, loss of appetite, nausea, vomiting, vomiting blood, weight loss, and  weight gain.        Denies anal fissure, black tarry stools, change in bowel habit, constipation, diarrhea, diverticulosis, fecal incontinence, heme positive stool, hemorrhoids, irritable bowel syndrome, jaundice, light color stool, liver problems, rectal bleeding, and  rectal pain.    Current Medications (verified): 1)  Anusol-Hc 25 Mg Supp (Hydrocortisone Acetate) .... As Needed 2)  Prilosec 20 Mg Cpdr (Omeprazole) .Marland Kitchen.. 1 Tablet By Mouth Daily 3)  Miralax  Powd (Polyethylene Glycol 3350) .... As Directed , As Needed  Allergies (verified): 1)  ! Codeine  Past History:  Past Medical History: Colon Polyps- benign colonic mucosa 11/24/03 Diverticulosis Esophageal Stricture GERD Family hx of colon polyps IBS  Past Surgical History: Reviewed history from 10/23/2010 and no changes required. Nasal surgery D & C x 2 Laparoscopy, no surgery  Family History: Reviewed history from 12/11/2010 and no changes required. Family History of Colon Polyps: Brother Family History of Colon  Cancer: ???Grandmother Family History of Heart Disease: Brother  Social History: Retired Patient is a former smoker.-stopped over 45 years ago Illicit Drug Use - no  Review of Systems       The patient complains of allergy/sinus.  The patient denies anemia, anxiety-new, arthritis/joint pain, back pain, blood in urine, breast changes/lumps, change in vision, confusion, cough, coughing up blood, depression-new, fainting, fatigue, fever, headaches-new, hearing problems, heart murmur, heart rhythm changes, itching, menstrual pain, muscle pains/cramps, night sweats, nosebleeds, pregnancy symptoms, shortness of breath, skin rash, sleeping problems, sore throat, swelling of feet/legs, swollen lymph glands, thirst - excessive , urination - excessive , urination changes/pain, urine leakage, vision changes, and voice change.         .rod   Vital Signs:  Patient profile:   65 year old female Height:      62 inches Weight:      143 pounds BMI:     26.25 BSA:     1.66 Pulse rate:   74 / minute Pulse rhythm:   regular BP sitting:   120 / 64  (left arm) Cuff size:   regular  Vitals Entered By: Ok Anis CMA (December 16, 2010 10:07 AM)   Impression & Recommendations:  Problem # 1:  GASTRITIS, ACUTE (ICD-535.00) Patient had H Pylori gastritis on upper endoscopy. Patient took only 6 days of the H Pylori regimen. She is not interested in a [employer]  breath test or repeat upper endoscopy. Her symptoms of gastroesophageal reflux are controlled on Prilosec 20 mg daily. She will notify us if she develops other digestive problems.  Problem # 2:  SCREENING, COLON  CANCER (ICD-V76.51) Patient is status post recent colonoscopy with findings of internal hemorrhoids. A recall colonoscopy will be due in 10 years, in January 2022.  Patient Instructions: 1)  Please pick up your prescriptions at the pharmacy. Electronic prescription(s) has already been sent for Prilosec 20 mg. You should take 1 tablet by  mouth once daily. We have sent you a 90 day supply. 2)  A recall colonoscopy will be due in January 2022. 3)  Consider pylori tech breath test to determine the effectiveness of H. pylori treatment. 4)  The medication list was reviewed and reconciled.  All changed / newly prescribed medications were explained.  A complete medication list was provided to the patient / caregiver. Prescriptions: PRILOSEC 20 MG CPDR (OMEPRAZOLE) Take 1 tablet by mouth once a day  #90 x 1   Entered by:   Lamona Curl CMA (AAMA)   Authorized by:   Hart Carwin MD   Signed by:   Lamona Curl CMA (AAMA) on 12/16/2010   Method used:   Electronically to        [facility]. [phone]* (retail)       [address]       [place], [place]  [address]       Ph: [phone]       Fax: [phone]   RxID:   [id number]

## 2011-09-02 ENCOUNTER — Other Ambulatory Visit: Payer: Self-pay | Admitting: Obstetrics and Gynecology

## 2011-09-02 DIAGNOSIS — Z1231 Encounter for screening mammogram for malignant neoplasm of breast: Secondary | ICD-10-CM

## 2011-10-07 ENCOUNTER — Ambulatory Visit: Payer: Medicare Other

## 2011-10-09 ENCOUNTER — Telehealth: Payer: Self-pay | Admitting: Internal Medicine

## 2011-10-09 NOTE — Telephone Encounter (Signed)
Patient is wanting to be seen due to abdominal pain, bloating and burning.  She will come in and see Dr Juanda Chance 10/16/11 9:45

## 2011-10-14 ENCOUNTER — Encounter: Payer: Self-pay | Admitting: *Deleted

## 2011-10-15 ENCOUNTER — Other Ambulatory Visit (INDEPENDENT_AMBULATORY_CARE_PROVIDER_SITE_OTHER): Payer: Medicare Other

## 2011-10-15 ENCOUNTER — Encounter: Payer: Self-pay | Admitting: Internal Medicine

## 2011-10-15 ENCOUNTER — Ambulatory Visit (INDEPENDENT_AMBULATORY_CARE_PROVIDER_SITE_OTHER): Payer: Medicare Other | Admitting: Internal Medicine

## 2011-10-15 DIAGNOSIS — R143 Flatulence: Secondary | ICD-10-CM

## 2011-10-15 DIAGNOSIS — R140 Abdominal distension (gaseous): Secondary | ICD-10-CM

## 2011-10-15 DIAGNOSIS — K589 Irritable bowel syndrome without diarrhea: Secondary | ICD-10-CM

## 2011-10-15 DIAGNOSIS — R141 Gas pain: Secondary | ICD-10-CM

## 2011-10-15 LAB — BUN: BUN: 16 mg/dL (ref 6–23)

## 2011-10-15 MED ORDER — ALIGN 4 MG PO CAPS: 1.0000 | ORAL_CAPSULE | ORAL | Status: DC

## 2011-10-15 MED ORDER — PSYLLIUM 28 % PO PACK: 1.0000 | PACK | Freq: Two times a day (BID) | ORAL | Status: DC

## 2011-10-15 MED ORDER — HYOSCYAMINE SULFATE 0.125 MG SL SUBL: 0.1250 mg | SUBLINGUAL_TABLET | SUBLINGUAL | Status: DC | PRN

## 2011-10-15 NOTE — Progress Notes (Signed)
Victoria Peters 01/06/1946 MRN [id number]   History of Present Illness:  This is a 65 year old white female with a history of irritable bowel syndrome and a recent exacerbation of abdominal distention and bloating. She had an upper endoscopy and colonoscopy in January 2012 with findings of H. pylori gastritis and external hemorrhoids. She was unable to tolerate amoxicillin and Biaxin and was able to take her medication only for 6 days. She is now complaining of  abdominal distention and burning pain. Her bowel habits are irregular. She denies rectal bleeding. There has been no weight loss.   Past Medical History  Diagnosis Date  . Gastritis   . Helicobacter pylori gastritis   . Diverticulosis   . Esophageal stricture   . GERD (gastroesophageal reflux disease)   . IBS (irritable bowel syndrome)   . Internal hemorrhoids   . External hemorrhoids   . Hiatal hernia   . Hyperlipemia   . Thyroid disease    Past Surgical History  Procedure Date  . Nose surgery   . Dilation and curettage of uterus     x 2  . Laparoscopy     reports that she has quit smoking. She has never used smokeless tobacco. She reports that she does not drink alcohol or use illicit drugs. family history includes Colon cancer in an unspecified family member; Colon polyps in her brother; and Heart disease in her brother. Allergies  Allergen Reactions  . Codeine         Review of Systems: Denies heartburn, nausea vomiting. Positive for irregular bowel habits  The remainder of the 10 point ROS is negative except as outlined in H&P   Physical Exam: General appearance  Well developed, in no distress. Eyes- non icteric. HEENT nontraumatic, normocephalic. Mouth no lesions, tongue papillated, no cheilosis. Neck supple without adenopathy, thyroid not enlarged, no carotid bruits, no JVD. Lungs Clear to auscultation bilaterally. Cor normal S1, normal S2, regular rhythm, no murmur,  quiet precordium. Abdomen:  Distended abdomen with normal active bowel sounds. No tympany. After distracting patient, her abdomen relaxed and was almost flat. There is no focal tenderness. No palpable mass. Rectal: Soft Hemoccult negative stool. Extremities no pedal edema. Skin no lesions. Neurological alert and oriented x 3. Psychological normal mood and affect.  Assessment and Plan:  Problem #1 Intermittent abdominal distention in a postmenopausal female. A recent upper endoscopy and colonoscopy were nondiagnostic. I suspect we are dealing with an irritable bowel syndrome. At the same time, we need to rule out malignancy or a space-occupying lesion all the abdomen. For that reason, we will proceed with a CT scan of the abdomen and pelvis with IV and oral contrast. We will start her on Metamucil 2 teaspoons daily and probiotic, once daily. We will also check her tissue transglutaminase level.   10/15/2011 [name]

## 2011-10-15 NOTE — Patient Instructions (Signed)
Your physician has requested that you go to the basement for the following lab work before leaving today: BUN, Creatinine, TtG We have given you samples of Align. This puts good bacteria back into your intestines. You should take 1 capsule by mouth once daily. If this works well for you, it can be purchased over the counter. We have given you samples of metamucil.  You have been scheduled for a CT scan of the abdomen and pelvis at  CT ([address]---this is in the same building as [facility]).   You are scheduled on 10/17/11 at 10:00 am. You should arrive 15 minutes prior to your appointment time for registration. Please follow the written instructions below on the day of your exam:  WARNING: IF YOU ARE ALLERGIC TO IODINE/X-RAY DYE, PLEASE NOTIFY RADIOLOGY IMMEDIATELY AT [phone]! YOU WILL BE GIVEN A 13 HOUR PREMEDICATION PREP.  1) Do not eat or drink anything after 6:00 am (4 hours prior to your test) 2) You have been given 2 bottles of oral contrast to drink. The solution may taste better if refrigerated, but do NOT add ice or any other liquid to this solution. Shake well before drinking.    Drink 1 bottle of contrast @ 8:00 am (2 hours prior to your exam)  Drink 1 bottle of contrast @ 9:00 am (1 hour prior to your exam)  You may take any medications as prescribed with a small amount of water except for the following: Metformin, Glucophage, Glucovance, Avandamet, Riomet, Fortamet, Actoplus Met, Janumet, Glumetza or Metaglip. The above medications must be held the day of the exam AND 48 hours after the exam.  The purpose of you drinking the oral contrast is to aid in the visualization of your intestinal tract. The contrast solution may cause some diarrhea. Before your exam is started, you will be given a small amount of fluid to drink. Depending on your individual set of symptoms, you may also receive an intravenous injection of x-ray contrast/dye. Plan on  being at [facility] for 30 minutes or long, depending on the type of exam you are having performed.  If you have any questions regarding your exam or if you need to reschedule, you may call the CT department at [phone] between the hours of 8:00 am and 5:00 pm, Monday-Friday.  ________________________________________________________________________ CC: Dr Juleen China

## 2011-10-16 ENCOUNTER — Ambulatory Visit: Payer: Medicare Other | Admitting: Gastroenterology

## 2011-10-17 ENCOUNTER — Telehealth: Payer: Self-pay | Admitting: *Deleted

## 2011-10-17 ENCOUNTER — Ambulatory Visit (INDEPENDENT_AMBULATORY_CARE_PROVIDER_SITE_OTHER)
Admission: RE | Admit: 2011-10-17 | Discharge: 2011-10-17 | Disposition: A | Discharge status: Home / Self Care | Payer: Medicare Other | Source: Ambulatory Visit | Attending: Internal Medicine | Admitting: Internal Medicine

## 2011-10-17 DIAGNOSIS — R140 Abdominal distension (gaseous): Secondary | ICD-10-CM

## 2011-10-17 DIAGNOSIS — R141 Gas pain: Secondary | ICD-10-CM

## 2011-10-17 DIAGNOSIS — R143 Flatulence: Secondary | ICD-10-CM

## 2011-10-17 MED ORDER — IOHEXOL 300 MG/ML  SOLN
100.0000 mL | Freq: Once | INTRAMUSCULAR | Status: AC | PRN
  Administered 2011-10-17: 100 mL via INTRAVENOUS

## 2011-10-17 NOTE — Telephone Encounter (Signed)
Spoke with patient and gave her the lab results per Dr. Juanda Chance.

## 2011-10-17 NOTE — Telephone Encounter (Signed)
Message copied by Daphine Deutscher on Fri Oct 17, 2011  8:45 AM ------      Message from: Hart Carwin      Created: Fri Oct 17, 2011  8:16 AM       Please call pt with negative sprue test

## 2011-10-20 ENCOUNTER — Telehealth: Payer: Self-pay | Admitting: *Deleted

## 2011-10-20 DIAGNOSIS — N289 Disorder of kidney and ureter, unspecified: Secondary | ICD-10-CM

## 2011-10-20 NOTE — Telephone Encounter (Signed)
Message copied by Daphine Deutscher on Mon Oct 20, 2011 10:09 AM ------      Message from: Hart Carwin      Created: Fri Oct 17, 2011  9:00 PM       Please call pt with results of the CT scan, which shows a lesion in the left kidney which needs to be further defined. Radiologist suggests MRI  with and without contrast attention left kidney lesion.

## 2011-10-20 NOTE — Telephone Encounter (Signed)
Left a message for patient to call me. Scheduled MRI of abdomen with and without contrast on 10/23/11 [facility] radiology 8:45/9:00 AM. NPO 4 hours prior.

## 2011-10-21 NOTE — Telephone Encounter (Signed)
Left a message with family for patient to call me.

## 2011-10-21 NOTE — Telephone Encounter (Signed)
Spoke with patient and gave her the results and appointment for MRI with instructions to be NPO 4 hours prior.

## 2011-10-23 ENCOUNTER — Ambulatory Visit (HOSPITAL_COMMUNITY)
Admission: RE | Admit: 2011-10-23 | Discharge: 2011-10-23 | Disposition: A | Discharge status: Home / Self Care | Payer: Medicare Other | Source: Ambulatory Visit | Attending: Internal Medicine | Admitting: Internal Medicine

## 2011-10-23 ENCOUNTER — Telehealth: Payer: Self-pay | Admitting: *Deleted

## 2011-10-23 DIAGNOSIS — K5710 Diverticulosis of small intestine without perforation or abscess without bleeding: Secondary | ICD-10-CM | POA: Insufficient documentation

## 2011-10-23 DIAGNOSIS — Q619 Cystic kidney disease, unspecified: Secondary | ICD-10-CM | POA: Insufficient documentation

## 2011-10-23 DIAGNOSIS — N289 Disorder of kidney and ureter, unspecified: Secondary | ICD-10-CM | POA: Insufficient documentation

## 2011-10-23 MED ORDER — GADOBENATE DIMEGLUMINE 529 MG/ML IV SOLN
15.0000 mL | Freq: Once | INTRAVENOUS | Status: AC | PRN
  Administered 2011-10-23: 13 mL via INTRAVENOUS

## 2011-10-23 NOTE — Telephone Encounter (Signed)
Message copied by Daphine Deutscher on Thu Oct 23, 2011  2:39 PM ------      Message from: Hart Carwin      Created: Thu Oct 23, 2011 12:57 PM       I have talked to the pt about large hemorrhagic cyst in her left kidney which will have to be followed by urology. Please refer to [facility] "large hemorrhagic cyst" left kidney. She is also having lot of urinary frequency.

## 2011-10-23 NOTE — Telephone Encounter (Signed)
Spoke with [facility] and scheduled patient on 11/25/11  3:15PM/3:30 PM with Dr. Brunilda Payor. Faxed records. Patient notified of appointment.

## 2011-10-29 ENCOUNTER — Ambulatory Visit: Payer: Medicare Other

## 2011-11-05 ENCOUNTER — Ambulatory Visit: Payer: Medicare Other

## 2011-11-05 ENCOUNTER — Other Ambulatory Visit: Payer: Self-pay | Admitting: Internal Medicine

## 2012-03-03 ENCOUNTER — Encounter: Payer: Self-pay | Admitting: *Deleted

## 2012-03-10 ENCOUNTER — Ambulatory Visit: Payer: Medicare Other | Admitting: Internal Medicine

## 2012-04-05 ENCOUNTER — Other Ambulatory Visit: Payer: Self-pay | Admitting: Orthopaedic Surgery

## 2012-04-05 DIAGNOSIS — R2241 Localized swelling, mass and lump, right lower limb: Secondary | ICD-10-CM

## 2012-04-12 ENCOUNTER — Other Ambulatory Visit: Payer: Medicare Other

## 2012-08-05 ENCOUNTER — Ambulatory Visit: Payer: Medicare Other

## 2012-08-10 ENCOUNTER — Ambulatory Visit
Admission: RE | Admit: 2012-08-10 | Discharge: 2012-08-10 | Disposition: A | Discharge status: Home / Self Care | Payer: Medicare Other | Source: Ambulatory Visit | Attending: Obstetrics and Gynecology | Admitting: Obstetrics and Gynecology

## 2012-08-10 DIAGNOSIS — Z1231 Encounter for screening mammogram for malignant neoplasm of breast: Secondary | ICD-10-CM

## 2012-09-14 ENCOUNTER — Ambulatory Visit
Admission: RE | Admit: 2012-09-14 | Discharge: 2012-09-14 | Disposition: A | Discharge status: Home / Self Care | Payer: Medicare Other | Source: Ambulatory Visit | Attending: Orthopaedic Surgery | Admitting: Orthopaedic Surgery

## 2012-09-14 DIAGNOSIS — R2241 Localized swelling, mass and lump, right lower limb: Secondary | ICD-10-CM

## 2012-09-14 MED ORDER — GADOBENATE DIMEGLUMINE 529 MG/ML IV SOLN
12.0000 mL | Freq: Once | INTRAVENOUS | Status: AC | PRN
  Administered 2012-09-14: 12 mL via INTRAVENOUS

## 2012-10-04 ENCOUNTER — Telehealth: Payer: Self-pay | Admitting: Internal Medicine

## 2012-10-04 ENCOUNTER — Encounter: Payer: Self-pay | Admitting: Internal Medicine

## 2012-10-04 NOTE — Telephone Encounter (Signed)
Left a message for patient to call me. 

## 2012-10-04 NOTE — Telephone Encounter (Signed)
Patient calling to report burning in stomach, nausea and acid reflux x 2 months. It is getting worse and keeps her up at night. She is no longer taking Omeprazole due to it causing gas. Dr. Juleen China has given her something else to take but she does not know the name of it. It is not helping much. She is taking anti reflux measures such as elevating HOB and not eating several hours prior to going to bed but nothing is helping. Scheduled patient with Dr. Juanda Chance on 10/06/12 at 8:45 AM.

## 2012-10-06 ENCOUNTER — Ambulatory Visit (INDEPENDENT_AMBULATORY_CARE_PROVIDER_SITE_OTHER): Payer: Medicare Other | Admitting: Internal Medicine

## 2012-10-06 ENCOUNTER — Encounter: Payer: Self-pay | Admitting: Internal Medicine

## 2012-10-06 VITALS — BP 112/76 | HR 74 | Ht 62.0 in | Wt 137.2 lb

## 2012-10-06 DIAGNOSIS — R140 Abdominal distension (gaseous): Secondary | ICD-10-CM

## 2012-10-06 DIAGNOSIS — R143 Flatulence: Secondary | ICD-10-CM

## 2012-10-06 DIAGNOSIS — R110 Nausea: Secondary | ICD-10-CM

## 2012-10-06 MED ORDER — PSYLLIUM 28 % PO PACK: PACK | ORAL | Status: DC

## 2012-10-06 MED ORDER — HYOSCYAMINE SULFATE 0.125 MG SL SUBL: SUBLINGUAL_TABLET | SUBLINGUAL | Status: DC

## 2012-10-06 NOTE — Patient Instructions (Addendum)
We have sent the following medications to your pharmacy for you to pick up at your convenience: Levsin SL  Please start taking Metamucil 1 teaspoon daily again.  You have been scheduled for an abdominal ultrasound at [facility] (1st floor of hospital) on Thursday, 10/07/12 at 8:00 am. Please arrive 15 minutes prior to your appointment for registration. Make certain not to have anything to eat or drink 6 hours prior to your appointment. Should you need to reschedule your appointment, please contact radiology at [phone]. This test typically takes about 30 minutes to perform.  You have been scheduled for an H Pylori breath test. Please follow instructions given to you today.  CC: Darci Needle, MD

## 2012-10-06 NOTE — Progress Notes (Signed)
Victoria Peters [birth year] MRN [id number]  History of Present Illness:  This is a 66 year old white female with a history of irritable bowel syndrome and gastroesophageal reflux. She has been experiencing abdominal distention on a daily basis. An upper endoscopy in January 2012 was positive for H. pylori. She was treated with triple antibiotics but was unable to tolerate it and did not finish the treatment. Her colonoscopy in January 2012 was normal except for external hemorrhoids. A CT scan of the abdomen in December 2012 showed 9.5 mm exophytic lesion of the left kidney which was evaluated by a urologist and was thought to represent a hemorrhagic cyst. It also showed a large duodenal diverticulum. Patient has alternating diarrhea and constipation. She discontinued her Metamucil but continues on probiotics. She is also on Zantac 150 mg a day.   Past Medical History  Diagnosis Date  . Gastritis   . Helicobacter pylori gastritis   . Diverticulosis   . Esophageal stricture   . GERD (gastroesophageal reflux disease)   . IBS (irritable bowel syndrome)   . Internal hemorrhoids   . External hemorrhoids   . Hiatal hernia   . Hyperlipemia   . Thyroid disease   . Renal cyst    Past Surgical History  Procedure Date  . Nose surgery   . Dilation and curettage of uterus     x 2  . Laparoscopy     reports that she quit smoking about 45 years ago. She has never used smokeless tobacco. She reports that she does not drink alcohol or use illicit drugs. family history includes COPD in an unspecified family member; Colon cancer in an unspecified family member; Colon polyps in her brother; Heart disease in her brother; and Lung cancer in her father. Allergies  Allergen Reactions  . Codeine         Review of Systems:  The remainder of the 10 point ROS is negative except as outlined in H&P   Physical Exam: General appearance  Well developed, in no distress. Eyes- non icteric. HEENT  nontraumatic, normocephalic. Mouth no lesions, tongue papillated, no cheilosis. Neck supple without adenopathy, thyroid not enlarged, no carotid bruits, no JVD. Lungs Clear to auscultation bilaterally. Cor normal S1, normal S2, regular rhythm, no murmur,  quiet precordium. Abdomen: Initially distended and tense but relaxed during the exam, there was no tenderness. Bowel sounds are slightly hyperactive. There was no palpable mass. Liver edge at costal margin. Rectal: Marked formed Hemoccult negative stool. Extremities no pedal edema. Skin no lesions. Neurological alert and oriented x 3. Psychological normal mood and affect.  Assessment and Plan:  Problem #1 Dyspepsia, abdominal distention and bloating consistent with irritable bowel syndrome. We will obtain an upper abdominal ultrasound and will increase her Zantac to 150 mg by mouth twice a day. She needs to restart Metamucil 1 teaspoon daily and continue probiotics. She will also get Levsin sublingual 0.125 mg to take on an as necessary basis for abdominal distention. As far as her H. pylori status is concerned, we will proceed with a [employer] breath test since she did not finish the therapy last year and may be having an ongoing infection. If positive, she will have to be retreated with a regimen which she will be able to tolerate. She is up-to-date on her upper endoscopy and colonoscopy.  10/06/2012 [name]

## 2012-10-07 ENCOUNTER — Ambulatory Visit (HOSPITAL_COMMUNITY)
Admission: RE | Admit: 2012-10-07 | Discharge: 2012-10-07 | Disposition: A | Discharge status: Home / Self Care | Payer: Medicare Other | Source: Ambulatory Visit | Attending: Internal Medicine | Admitting: Internal Medicine

## 2012-10-07 DIAGNOSIS — R143 Flatulence: Secondary | ICD-10-CM | POA: Insufficient documentation

## 2012-10-07 DIAGNOSIS — R141 Gas pain: Secondary | ICD-10-CM | POA: Insufficient documentation

## 2012-10-07 DIAGNOSIS — R110 Nausea: Secondary | ICD-10-CM | POA: Insufficient documentation

## 2012-10-07 DIAGNOSIS — Q619 Cystic kidney disease, unspecified: Secondary | ICD-10-CM | POA: Insufficient documentation

## 2012-10-07 DIAGNOSIS — R142 Eructation: Secondary | ICD-10-CM | POA: Insufficient documentation

## 2012-10-07 DIAGNOSIS — R140 Abdominal distension (gaseous): Secondary | ICD-10-CM

## 2012-10-08 ENCOUNTER — Other Ambulatory Visit: Payer: Self-pay | Admitting: Internal Medicine

## 2012-10-08 ENCOUNTER — Other Ambulatory Visit (INDEPENDENT_AMBULATORY_CARE_PROVIDER_SITE_OTHER): Payer: Medicare Other

## 2012-10-08 ENCOUNTER — Other Ambulatory Visit: Payer: Self-pay | Admitting: *Deleted

## 2012-10-08 DIAGNOSIS — R109 Unspecified abdominal pain: Secondary | ICD-10-CM

## 2012-10-08 DIAGNOSIS — R140 Abdominal distension (gaseous): Secondary | ICD-10-CM

## 2012-10-08 DIAGNOSIS — R110 Nausea: Secondary | ICD-10-CM

## 2012-10-08 LAB — LIPASE: Lipase: 22 U/L (ref 11.0–59.0)

## 2012-10-08 LAB — COMPREHENSIVE METABOLIC PANEL
AST: 23 U/L (ref 0–37)
Alkaline Phosphatase: 72 U/L (ref 39–117)
BUN: 16 mg/dL (ref 6–23)
Creatinine, Ser: 0.8 mg/dL (ref 0.4–1.2)
Glucose, Bld: 127 mg/dL — ABNORMAL HIGH (ref 70–99)
Total Bilirubin: 0.8 mg/dL (ref 0.3–1.2)

## 2012-10-08 LAB — CBC WITH DIFFERENTIAL/PLATELET
Basophils Absolute: 0 10*3/uL (ref 0.0–0.1)
Eosinophils Relative: 1.4 % (ref 0.0–5.0)
HCT: 42.5 % (ref 36.0–46.0)
Hemoglobin: 14 g/dL (ref 12.0–15.0)
Lymphocytes Relative: 23.7 % (ref 12.0–46.0)
Lymphs Abs: 1.4 10*3/uL (ref 0.7–4.0)
Monocytes Relative: 8.1 % (ref 3.0–12.0)
Neutro Abs: 3.8 10*3/uL (ref 1.4–7.7)
WBC: 5.7 10*3/uL (ref 4.5–10.5)

## 2012-10-08 LAB — AMYLASE: Amylase: 58 U/L (ref 27–131)

## 2012-10-08 MED ORDER — LORAZEPAM 0.5 MG PO TABS: ORAL_TABLET | ORAL | Status: DC

## 2012-10-14 ENCOUNTER — Other Ambulatory Visit: Payer: Self-pay | Admitting: Internal Medicine

## 2012-10-14 ENCOUNTER — Ambulatory Visit (HOSPITAL_COMMUNITY)
Admission: RE | Admit: 2012-10-14 | Discharge: 2012-10-14 | Disposition: A | Discharge status: Home / Self Care | Payer: Medicare Other | Source: Ambulatory Visit | Attending: Internal Medicine | Admitting: Internal Medicine

## 2012-10-14 DIAGNOSIS — R142 Eructation: Secondary | ICD-10-CM | POA: Insufficient documentation

## 2012-10-14 DIAGNOSIS — R140 Abdominal distension (gaseous): Secondary | ICD-10-CM

## 2012-10-14 DIAGNOSIS — N281 Cyst of kidney, acquired: Secondary | ICD-10-CM | POA: Insufficient documentation

## 2012-10-14 DIAGNOSIS — K5710 Diverticulosis of small intestine without perforation or abscess without bleeding: Secondary | ICD-10-CM | POA: Insufficient documentation

## 2012-10-14 DIAGNOSIS — R110 Nausea: Secondary | ICD-10-CM

## 2012-10-14 DIAGNOSIS — R143 Flatulence: Secondary | ICD-10-CM | POA: Insufficient documentation

## 2012-10-14 DIAGNOSIS — R141 Gas pain: Secondary | ICD-10-CM | POA: Insufficient documentation

## 2012-10-14 MED ORDER — GADOBENATE DIMEGLUMINE 529 MG/ML IV SOLN
13.0000 mL | Freq: Once | INTRAVENOUS | Status: AC | PRN
  Administered 2012-10-14: 13 mL via INTRAVENOUS

## 2012-10-15 ENCOUNTER — Other Ambulatory Visit: Payer: Self-pay | Admitting: *Deleted

## 2012-10-15 MED ORDER — SACCHAROMYCES BOULARDII 250 MG PO CAPS: ORAL_CAPSULE | ORAL | Status: DC

## 2012-10-15 MED ORDER — OMEPRAZOLE 20 MG PO CPDR: DELAYED_RELEASE_CAPSULE | ORAL | Status: DC

## 2012-10-15 MED ORDER — AMOXICILLIN 500 MG PO CAPS: ORAL_CAPSULE | ORAL | Status: DC

## 2012-11-05 ENCOUNTER — Encounter: Payer: Self-pay | Admitting: Internal Medicine

## 2013-08-07 ENCOUNTER — Emergency Department (HOSPITAL_BASED_OUTPATIENT_CLINIC_OR_DEPARTMENT_OTHER): Payer: Medicare Other

## 2013-08-07 ENCOUNTER — Encounter (HOSPITAL_BASED_OUTPATIENT_CLINIC_OR_DEPARTMENT_OTHER): Payer: Self-pay

## 2013-08-07 ENCOUNTER — Emergency Department (HOSPITAL_BASED_OUTPATIENT_CLINIC_OR_DEPARTMENT_OTHER)
Admission: EM | Admit: 2013-08-07 | Discharge: 2013-08-07 | Disposition: A | Discharge status: Home / Self Care | Payer: Medicare Other | Attending: Emergency Medicine | Admitting: Emergency Medicine

## 2013-08-07 DIAGNOSIS — K625 Hemorrhage of anus and rectum: Secondary | ICD-10-CM | POA: Insufficient documentation

## 2013-08-07 DIAGNOSIS — R110 Nausea: Secondary | ICD-10-CM | POA: Insufficient documentation

## 2013-08-07 DIAGNOSIS — Z8619 Personal history of other infectious and parasitic diseases: Secondary | ICD-10-CM | POA: Insufficient documentation

## 2013-08-07 DIAGNOSIS — K5900 Constipation, unspecified: Secondary | ICD-10-CM | POA: Insufficient documentation

## 2013-08-07 DIAGNOSIS — Z87891 Personal history of nicotine dependence: Secondary | ICD-10-CM | POA: Insufficient documentation

## 2013-08-07 DIAGNOSIS — Z8679 Personal history of other diseases of the circulatory system: Secondary | ICD-10-CM | POA: Insufficient documentation

## 2013-08-07 DIAGNOSIS — K219 Gastro-esophageal reflux disease without esophagitis: Secondary | ICD-10-CM | POA: Insufficient documentation

## 2013-08-07 DIAGNOSIS — E079 Disorder of thyroid, unspecified: Secondary | ICD-10-CM | POA: Insufficient documentation

## 2013-08-07 DIAGNOSIS — R109 Unspecified abdominal pain: Secondary | ICD-10-CM

## 2013-08-07 DIAGNOSIS — K589 Irritable bowel syndrome without diarrhea: Secondary | ICD-10-CM | POA: Insufficient documentation

## 2013-08-07 DIAGNOSIS — E785 Hyperlipidemia, unspecified: Secondary | ICD-10-CM | POA: Insufficient documentation

## 2013-08-07 DIAGNOSIS — Z87798 Personal history of other (corrected) congenital malformations: Secondary | ICD-10-CM | POA: Insufficient documentation

## 2013-08-07 DIAGNOSIS — Z79899 Other long term (current) drug therapy: Secondary | ICD-10-CM | POA: Insufficient documentation

## 2013-08-07 LAB — COMPREHENSIVE METABOLIC PANEL
ALT: 21 U/L (ref 0–35)
AST: 24 U/L (ref 0–37)
Albumin: 4.2 g/dL (ref 3.5–5.2)
Alkaline Phosphatase: 84 U/L (ref 39–117)
CO2: 28 mEq/L (ref 19–32)
Calcium: 9.7 mg/dL (ref 8.4–10.5)
Chloride: 108 mEq/L (ref 96–112)
GFR calc non Af Amer: 64 mL/min — ABNORMAL LOW (ref >90)
Potassium: 3.8 mEq/L (ref 3.5–5.1)
Sodium: 142 mEq/L (ref 135–145)
Total Protein: 6.6 g/dL (ref 6.0–8.3)

## 2013-08-07 LAB — CBC WITH DIFFERENTIAL/PLATELET
Basophils Absolute: 0 10*3/uL (ref 0.0–0.1)
Basophils Relative: 0 % (ref 0–1)
Eosinophils Relative: 4 % (ref 0–5)
Lymphocytes Relative: 22 % (ref 12–46)
MCV: 89.7 fL (ref 78.0–100.0)
Neutro Abs: 3.2 10*3/uL (ref 1.7–7.7)
Platelets: 216 10*3/uL (ref 150–400)
RDW: 12.7 % (ref 11.5–15.5)
WBC: 5.1 10*3/uL (ref 4.0–10.5)

## 2013-08-07 LAB — URINALYSIS, ROUTINE W REFLEX MICROSCOPIC
Glucose, UA: NEGATIVE mg/dL
Ketones, ur: NEGATIVE mg/dL
Leukocytes, UA: NEGATIVE
Nitrite: NEGATIVE
Specific Gravity, Urine: 1.016 (ref 1.005–1.030)
pH: 6 (ref 5.0–8.0)

## 2013-08-07 MED ORDER — DOCUSATE SODIUM 100 MG PO CAPS: 100.0000 mg | ORAL_CAPSULE | Freq: Two times a day (BID) | ORAL | Status: DC

## 2013-08-07 MED ORDER — DICYCLOMINE HCL 20 MG PO TABS: 20.0000 mg | ORAL_TABLET | Freq: Two times a day (BID) | ORAL | Status: DC

## 2013-08-07 MED ORDER — MORPHINE SULFATE 4 MG/ML IJ SOLN
4.0000 mg | INTRAMUSCULAR | Status: DC | PRN
  Administered 2013-08-07: 4 mg via INTRAVENOUS
  Filled 2013-08-07: qty 1

## 2013-08-07 MED ORDER — SODIUM CHLORIDE 0.9 % IV SOLN
1000.0000 mL | Freq: Once | INTRAVENOUS | Status: AC
  Administered 2013-08-07: 1000 mL via INTRAVENOUS

## 2013-08-07 MED ORDER — ONDANSETRON HCL 4 MG/2ML IJ SOLN
4.0000 mg | Freq: Once | INTRAMUSCULAR | Status: AC
  Administered 2013-08-07: 4 mg via INTRAVENOUS
  Filled 2013-08-07: qty 2

## 2013-08-07 MED ORDER — DICYCLOMINE HCL 10 MG/ML IM SOLN
20.0000 mg | Freq: Once | INTRAMUSCULAR | Status: AC
  Administered 2013-08-07: 20 mg via INTRAMUSCULAR
  Filled 2013-08-07: qty 2

## 2013-08-07 NOTE — ED Provider Notes (Signed)
CSN: [id number]     Arrival date & time 08/07/13  4098 History   None    Chief Complaint  Patient presents with  . Abdominal Pain    HPI  History of "spastic colon". Hasn't really had any flareups for several months maybe as long as a year. No acute history for H. pylori x2 as well. Symptoms seemed to start one or 2 days ago with "a "strange feeling" in her abdomen. Her lower abdomen and bilateral in her back into her flanks cramping and intermittent. This morning she states is about a 7/10. Nausea but no vomiting. Took a suppository last night and had a little bit of a movement. Heart normal bowel movement yesterday. Isn't sure beyond that when her last bowel movement was. No diarrhea. Small amount of blood with the bowel movement last night. It had internal hemorrhoids on her colonoscopy. She had a polyp removed. Does not know she has diverticuli.  No fevers. No chest pain. No skin rash. No joint pain. No additional symptoms.  Past Medical History  Diagnosis Date  . Gastritis   . Helicobacter pylori gastritis   . Diverticulosis   . Esophageal stricture   . GERD (gastroesophageal reflux disease)   . IBS (irritable bowel syndrome)   . Internal hemorrhoids   . External hemorrhoids   . Hiatal hernia   . Hyperlipemia   . Thyroid disease   . Renal cyst    Past Surgical History  Procedure Laterality Date  . Nose surgery    . Dilation and curettage of uterus      x 2  . Laparoscopy     Family History  Problem Relation Age of Onset  . Colon polyps Brother   . Colon cancer      grandmother???  . Heart disease Brother   . Lung cancer Father   . COPD     History  Substance Use Topics  . Smoking status: Former Smoker    Quit date: 11/03/1966  . Smokeless tobacco: Never Used  . Alcohol Use: No   OB History   Grav Para Term Preterm Abortions TAB SAB Ect Mult Living                 Review of Systems  Constitutional: Negative for fever, chills, diaphoresis, appetite change and  fatigue.  HENT: Negative for sore throat, mouth sores and trouble swallowing.   Eyes: Negative for visual disturbance.  Respiratory: Negative for cough, chest tightness, shortness of breath and wheezing.   Cardiovascular: Negative for chest pain.  Gastrointestinal: Positive for nausea, abdominal pain, constipation and anal bleeding. Negative for vomiting, diarrhea and abdominal distention.  Endocrine: Negative for polydipsia, polyphagia and polyuria.  Genitourinary: Negative for dysuria, frequency and hematuria.  Musculoskeletal: Negative for gait problem.  Skin: Negative for color change, pallor and rash.  Neurological: Negative for dizziness, syncope, light-headedness and headaches.  Hematological: Does not bruise/bleed easily.  Psychiatric/Behavioral: Negative for behavioral problems and confusion.    Allergies  Codeine  Home Medications   Current Outpatient Rx  Name  Route  Sig  Dispense  Refill  . AMBULATORY NON FORMULARY MEDICATION      Magnesium and Zinc  Once daily          . Ascorbic Acid (VITAMIN C PO)   Oral   Take 1 tablet by mouth daily.           . Calcium Citrate-Vitamin D (CITRACAL + D PO)   Oral   Take  1 tablet by mouth daily.           . Cholecalciferol 1000 UNITS capsule   Oral   Take 1,000 Units by mouth daily.           . Coenzyme Q10 (CO Q-10) 100 MG CAPS   Oral   Take 1 capsule by mouth daily.           . CRESTOR 20 MG tablet      One tablet by mouth once daily          . dicyclomine (BENTYL) 20 MG tablet   Oral   Take 1 tablet (20 mg total) by mouth 2 (two) times daily.   20 tablet   0   . docusate sodium (COLACE) 100 MG capsule   Oral   Take 1 capsule (100 mg total) by mouth every 12 (twelve) hours.   60 capsule   0   . EXPIRED: hyoscyamine (LEVSIN/SL) 0.125 MG SL tablet   Sublingual   Place 1 tablet (0.125 mg total) under the tongue every 4 (four) hours as needed for cramping.   50 tablet   0   . hyoscyamine  (LEVSIN/SL) 0.125 MG SL tablet      Dissolve 1 tablet under the tongue every 4-6 hours as needed for abdominal distention.   60 tablet   1   . levothyroxine (SYNTHROID, LEVOTHROID) 88 MCG tablet   Oral   Take 88 mcg by mouth daily.           Marland Kitchen LORazepam (ATIVAN) 0.5 MG tablet      Take one tablet one hour prior to MRI. May repeat just before MRI   2 tablet   0   . Omega-3 Fatty Acids (OMEGA 3 PO)   Oral   Take 1 capsule by mouth daily.           Marland Kitchen omeprazole (PRILOSEC) 20 MG capsule      Take one po BID x 14 days   28 capsule   3   . Probiotic Product (ALIGN) 4 MG CAPS   Oral   Take 1 capsule by mouth 1 day or 1 dose.   1 capsule   0   . psyllium (METAMUCIL SMOOTH TEXTURE) 28 % packet      Take 1 teaspoon dissolved in water/juice once daily   1 packet   0   . saccharomyces boulardii (FLORASTOR) 250 MG capsule      Take one po BID x 14 days   28 capsule   0   . simethicone (MYLICON) 125 MG chewable tablet   Oral   Chew 125 mg by mouth as needed.            BP 120/62  Pulse 69  Temp(Src) 97.7 F (36.5 C) (Oral)  Resp 18  SpO2 97% Physical Exam  Constitutional: She is oriented to person, place, and time. She appears well-developed and well-nourished. No distress.  Healthy appearing 67 year old female. Does not appear uncomfortable or distressed.  HENT:  Head: Normocephalic.  Eyes: Conjunctivae are normal. Pupils are equal, round, and reactive to light. No scleral icterus.  Neck: Normal range of motion. Neck supple. No thyromegaly present.  Cardiovascular: Normal rate and regular rhythm.  Exam reveals no gallop and no friction rub.   No murmur heard. Pulmonary/Chest: Effort normal and breath sounds normal. No respiratory distress. She has no wheezes. She has no rales.  Abdominal: Soft. Bowel sounds are normal. She exhibits no distension.  There is no tenderness. There is no rebound.  Soft abdomen. Pain is not reproduced  To examiation. No peritoneal  irritation. No focal tenderness.  Musculoskeletal: Normal range of motion.  Neurological: She is alert and oriented to person, place, and time.  Skin: Skin is warm and dry. No rash noted.  Psychiatric: She has a normal mood and affect. Her behavior is normal.    ED Course  Procedures (including critical care time) Labs Review Labs Reviewed  COMPREHENSIVE METABOLIC PANEL - Abnormal; Notable for the following:    GFR calc non Af Amer 64 (*)    GFR calc Af Amer 74 (*)    All other components within normal limits  URINALYSIS, ROUTINE W REFLEX MICROSCOPIC  CBC WITH DIFFERENTIAL  LIPASE, BLOOD   Imaging Review Dg Abd Acute W/chest  08/07/2013   CLINICAL DATA:  Lower abdominal pain and nausea.  EXAM: ACUTE ABDOMEN SERIES (ABDOMEN 2 VIEW & CHEST 1 VIEW)  COMPARISON:  Chest x-ray on 08/12/2012.  FINDINGS: There is no evidence of dilated bowel loops or free intraperitoneal air. Moderate fecal material is seen in the colon. No radiopaque calculi or other significant radiographic abnormality is seen. Heart size and mediastinal contours are within normal limits. Both lungs are clear.  IMPRESSION: Moderate fecal material. No bowel obstruction is identified. No acute cardiopulmonary disease.   Electronically Signed   By: Irish Lack M.D.   On: 08/07/2013 10:34    MDM   1. Abdominal pain   2. Irritable bowel syndrome (IBS)    Per the patient's report and per my history this sounds like a episode of exacerbation of irritable bowel. We'll x-ray her to get a different pill burden with the report of constipation. His given antiemetics and Bentyl. We'll reevaluate after completion of labs urine and x-rays.  Her labs reassuring. The white blood cell count shows no elevation. No urinary tract infection. X-ray show some right sided colonic stool no obstructive pattern. She's getting some relief with medications. Discussion: no tenderness no suggestion that this is a diverticulitis non-peritoneal benign  abdomen history of irritable bowel and symptoms consistent with that. Plan will be Bentyl and: Colace primary care followup    Roney Marion, MD 08/07/13 1117

## 2013-08-07 NOTE — ED Notes (Signed)
Patient here with generalized abdominal sharp pain that is radiating around to her back with nausea that started yesterday, no vomiting, no diarrhea. Reports gets some relief in supine position

## 2013-08-11 ENCOUNTER — Telehealth: Payer: Self-pay | Admitting: Internal Medicine

## 2013-08-11 ENCOUNTER — Encounter: Payer: Self-pay | Admitting: *Deleted

## 2013-08-11 NOTE — Telephone Encounter (Signed)
Spoke with patient and she went to Urgent Care on Sunday. She started having abdominal cramping, nausea and back pain on Saturday. She tried Levsin without much relief. She went to UC and was given stool softeners and Bentyl. She reports she had a good bowel movement but the cramping did not go away. Bentyl not helping much either. Continues to have nausea and cramping. Scheduled with Dr. Juanda Chance tomorrow at 3:45 PM.

## 2013-08-12 ENCOUNTER — Ambulatory Visit (INDEPENDENT_AMBULATORY_CARE_PROVIDER_SITE_OTHER): Payer: Medicare Other | Admitting: Internal Medicine

## 2013-08-12 ENCOUNTER — Encounter: Payer: Self-pay | Admitting: Internal Medicine

## 2013-08-12 VITALS — BP 106/66 | HR 76 | Ht 61.5 in | Wt 137.2 lb

## 2013-08-12 DIAGNOSIS — R1032 Left lower quadrant pain: Secondary | ICD-10-CM

## 2013-08-12 MED ORDER — HYOSCYAMINE SULFATE 0.125 MG SL SUBL: SUBLINGUAL_TABLET | SUBLINGUAL | Status: DC

## 2013-08-12 MED ORDER — METHYLPREDNISOLONE (PAK) 4 MG PO TABS: ORAL_TABLET | ORAL | Status: DC

## 2013-08-12 MED ORDER — HYDROCORTISONE ACETATE 25 MG RE SUPP: 25.0000 mg | Freq: Every day | RECTAL | Status: DC

## 2013-08-12 NOTE — Patient Instructions (Signed)
We have sent the following medications to your pharmacy for you to pick up at your convenience: Medrol Pak Anusol Levsin  You will be due for a recall colonoscopy in 11/2017. We will send you a reminder in the mail when it gets closer to that time.  CC: Dr Lacretia Nicks. Juleen China

## 2013-08-12 NOTE — Progress Notes (Signed)
Victoria Peters 03/31/1946 MRN [id number]   History of Present Illness:  This is a 67 year old, white female with an acute episode of lower abdominal pain and back pain 4 days ago for which she was evaluated in the emergency room last weekend.. She had a KUB which showed increased stool burden. Her white blood cell count was normal. We saw her in December 2013 for irritable bowel syndrome, bloating and gastroesophageal reflux. She had an upper endoscopy in January 2012 which was positive for H. pylori but she was unable to tolerate the triple therapy. A colonoscopy in January 2012 showed external hemorrhoids. There is a positive family history of colon cancer in a grandparent and polyps in her brother. A CT scan of the abdomen in December 2012 showed a 9.5 cm exophytic mass in the left kidney consistent with a hemorrhagic cyst. She had a positive pyloritech (h pylori) in December 2013. An upper abdominal ultrasound at that time showed questionable focus of stones in the common bile duct. An MRCP showed a normal common bile duct. Since her visit to the emergency room last week, patient has taken daily stool softeners and milk of magnesia with complete evacuation of the stool. The pain is mostly in her low back and continues during the night, especially when she turns over. She reports a history of low back pain.   Past Medical History  Diagnosis Date  . Gastritis   . Helicobacter pylori gastritis   . Diverticulosis   . Esophageal stricture   . GERD (gastroesophageal reflux disease)   . IBS (irritable bowel syndrome)   . Internal hemorrhoids   . External hemorrhoids   . Hiatal hernia   . Hyperlipemia   . Thyroid disease   . Renal cyst   . Helicobacter pylori (H. pylori)   . Duodenal diverticulum    Past Surgical History  Procedure Laterality Date  . Nose surgery    . Dilation and curettage of uterus      x 2  . Laparoscopy      reports that she quit smoking about 46 years ago. She has  never used smokeless tobacco. She reports that she does not drink alcohol or use illicit drugs. family history includes COPD in an other family member; Colon cancer in an other family member; Colon polyps in her brother; Heart disease in her brother; Lung cancer in her father. Allergies  Allergen Reactions  . Codeine         Review of Systems: Denies urinary symptoms, no fever. No rectal bleeding  The remainder of the 10 point ROS is negative except as outlined in H&P   Physical Exam: General appearance  Well developed, in no distress. Eyes- non icteric. HEENT nontraumatic, normocephalic. Mouth no lesions, tongue papillated, no cheilosis. Neck supple without adenopathy, thyroid not enlarged, no carotid bruits, no JVD. Lungs Clear to auscultation bilaterally. Cor normal S1, normal S2, regular rhythm, no murmur,  quiet precordium. Abdomen: Soft with minimal tenderness in left lower quadrant. Negative straight leg raising. Sitting up and laying down does not seem to precipitate the pain. She has point tenderness in the lumbosacral area in the midline. Bowel sounds are active. There is no palpable mass on abdominal exam. Rectal: Soft small amount of Hemoccult negative stool. Extremities no pedal edema. Skin no lesions. Neurological alert and oriented x 3. Psychological normal mood and affect.  Assessment and Plan:  Problem #72 67 year old white female with acute lower abdominal pain and lower back pain which she  initially attributed to constipation but she since then has used laxatives and had multiple bowel movements and the pain  Persists especially in the low back.. She is predominantly tender in the lower back and I suspect that her pain could originate in the lower lumbosacral spine. We will put her on a Medrol pack for 1 week. She will continue on the stool softeners as needed. Sh is also asking for Anusol-HC suppositories for hemorrhoids and  Levsin sublingual 0.125 mg for the  irritable bowel syndrome. 08/12/2013 [name]

## 2013-08-31 ENCOUNTER — Encounter: Payer: Self-pay | Admitting: Obstetrics and Gynecology

## 2013-08-31 DIAGNOSIS — E039 Hypothyroidism, unspecified: Secondary | ICD-10-CM | POA: Insufficient documentation

## 2013-08-31 DIAGNOSIS — E7800 Pure hypercholesterolemia, unspecified: Secondary | ICD-10-CM | POA: Insufficient documentation

## 2013-09-02 ENCOUNTER — Ambulatory Visit: Payer: Self-pay | Admitting: Gynecology

## 2013-09-02 ENCOUNTER — Ambulatory Visit: Payer: Self-pay | Admitting: Obstetrics and Gynecology

## 2013-09-20 ENCOUNTER — Other Ambulatory Visit: Payer: Self-pay

## 2013-09-20 DIAGNOSIS — Z1231 Encounter for screening mammogram for malignant neoplasm of breast: Secondary | ICD-10-CM

## 2013-10-05 ENCOUNTER — Ambulatory Visit (INDEPENDENT_AMBULATORY_CARE_PROVIDER_SITE_OTHER): Payer: Medicare Other | Admitting: Gynecology

## 2013-10-05 ENCOUNTER — Encounter: Payer: Self-pay | Admitting: Gynecology

## 2013-10-05 VITALS — Ht 62.0 in | Wt 133.0 lb

## 2013-10-05 DIAGNOSIS — N3941 Urge incontinence: Secondary | ICD-10-CM

## 2013-10-05 DIAGNOSIS — Z01419 Encounter for gynecological examination (general) (routine) without abnormal findings: Secondary | ICD-10-CM

## 2013-10-05 MED ORDER — SOLIFENACIN SUCCINATE 5 MG PO TABS: ORAL_TABLET | ORAL | Status: DC

## 2013-10-05 NOTE — Progress Notes (Signed)
67 y.o. Married Caucasian female   [id number] here for annual exam. Pt reports menses are regular.  She does not report hot flashes, does not have night sweats, does not have vaginal dryness.  She is not using lubricants.  She does not report post-menopasual bleeding.  Pt is without complaints.  Sees Dr Mallory Shirk for primary care. Pt reports issues with bladder leakage, tried vesicare 5mg  for less than 1w, stopped due to thirst 2007.  wears a pad daily-changes twice to 3x/d, always wet, using sanitary pantisheilds  No LMP recorded. Patient is postmenopausal.          Sexually active: no  The current method of family planning is post menopausal status.    Exercising: yes  The patient does not participate in regular exercise at present. Last pap: 06/26/2010 Abnormal PAP: no Mammogram: 08/10/12 , scheduled for next week BSE: no Colonoscopy: 2013 f/u 3-5 years DEXA: 2013 Alcohol: no Tobacco: no  Hgb: PCP ; Urine: PCP  Health Maintenance  Topic Date Due  . Tetanus/tdap  12/06/1963  . Zostavax  12/05/2004  . Pneumococcal Polysaccharide Vaccine Age 75 And Over  12/05/2009  . Influenza Vaccine  06/03/2013  . Colonoscopy  11/23/2013  . Mammogram  08/10/2014    Family History  Problem Relation Age of Onset  . Colon polyps Brother   . Colon cancer      grandmother???  . COPD    . Heart disease Brother   . Lung cancer Father   . Osteoporosis Mother   . Osteoporosis Sister   . CVA Maternal Grandmother   . CVA Maternal Grandfather   . CVA Paternal Grandmother   . Heart attack Paternal Grandfather 41    Patient Active Problem List   Diagnosis Date Noted  . Hypothyroid   . High cholesterol   . GASTRITIS, ACUTE 12/16/2010    Past Medical History  Diagnosis Date  . Gastritis   . Helicobacter pylori gastritis   . Diverticulosis   . Esophageal stricture   . GERD (gastroesophageal reflux disease)   . IBS (irritable bowel syndrome)   . Internal hemorrhoids   . External hemorrhoids   .  Hiatal hernia   . Hyperlipemia   . Thyroid disease   . Renal cyst   . Helicobacter pylori (H. pylori)   . Duodenal diverticulum   . Hypothyroid   . High cholesterol   . Diverticulosis 11/2003    Past Surgical History  Procedure Laterality Date  . Nose surgery      Deviated Septum   . Laparoscopy  1981  . Dilation and curettage of uterus      x 2    Allergies: Codeine  Current Outpatient Prescriptions  Medication Sig Dispense Refill  . AMBULATORY NON FORMULARY MEDICATION Magnesium  Once daily      . Ascorbic Acid (VITAMIN C PO) Take by mouth.      Marland Kitchen aspirin 81 MG tablet Take 81 mg by mouth daily.      . Calcium Citrate-Vitamin D (CITRACAL + D PO) Take 1 tablet by mouth daily.        . Cholecalciferol 1000 UNITS capsule Take 1,000 Units by mouth daily.        . Coenzyme Q10 (CO Q-10) 100 MG CAPS Take 1 capsule by mouth daily.        . CRESTOR 20 MG tablet Take 10 mg by mouth daily. One tablet by mouth once daily      . docusate sodium (COLACE)  100 MG capsule Take 1 capsule (100 mg total) by mouth every 12 (twelve) hours.  60 capsule  0  . hydrocortisone (ANUSOL-HC) 25 MG suppository Place 1 suppository (25 mg total) rectally at bedtime.  12 suppository  1  . hyoscyamine (LEVSIN/SL) 0.125 MG SL tablet Dissolve 1 tablet under the tongue every 4-6 hours as needed for abdominal distention.  60 tablet  2  . levothyroxine (SYNTHROID, LEVOTHROID) 88 MCG tablet Take 88 mcg by mouth daily.        . methylPREDNIsolone (MEDROL DOSPACK) 4 MG tablet follow package directions  21 tablet  0  . Multiple Vitamins-Minerals (MULTIVITAMIN PO) Take by mouth.      . Omega-3 Fatty Acids (OMEGA 3 PO) Take 1 capsule by mouth daily.        Marland Kitchen POTASSIUM PO Take by mouth.      . Probiotic Product (ALIGN) 4 MG CAPS Take 1 capsule by mouth 1 day or 1 dose.  1 capsule  0  . psyllium (METAMUCIL SMOOTH TEXTURE) 28 % packet Take 1 teaspoon dissolved in water/juice once daily  1 packet  0  . ranitidine (ZANTAC)  150 MG tablet Take 150 mg by mouth 2 (two) times daily.       No current facility-administered medications for this visit.    ROS: Pertinent items are noted in HPI.  Exam:    Ht 5\' 2"  (1.575 m)  Wt 133 lb (60.328 kg)  BMI 24.32 kg/m2 Weight change: @WEIGHTCHANGE @ Last 3 height recordings:  Ht Readings from Last 3 Encounters:  10/05/13 5\' 2"  (1.575 m)  08/12/13 5' 1.5" (1.562 m)  10/06/12 5\' 2"  (1.575 m)   General appearance: alert, cooperative and appears stated age Head: Normocephalic, without obvious abnormality, atraumatic Neck: no adenopathy, no carotid bruit, no JVD, supple, symmetrical, trachea midline and thyroid not enlarged, symmetric, no tenderness/mass/nodules Lungs: clear to auscultation bilaterally Breasts: normal appearance, no masses or tenderness Heart: regular rate and rhythm, S1, S2 normal, no murmur, click, rub or gallop Abdomen: soft, non-tender; bowel sounds normal; no masses,  no organomegaly Extremities: extremities normal, atraumatic, no cyanosis or edema Skin: Skin color, texture, turgor normal. No rashes or lesions Lymph nodes: Cervical, supraclavicular, and axillary nodes normal. no inguinal nodes palpated Neurologic: Grossly normal   Pelvic: External genitalia:  no lesions              Urethra: normal appearing urethra with no masses, tenderness or lesions              Bartholins and Skenes: normal                 Vagina: atrophic              Cervix: normal appearance              Pap taken: no        Bimanual Exam:  Uterus:  uterus is normal size, shape, consistency and nontender                                      Adnexa:    no masses                                      Rectovaginal: Confirms  Anus:  normal sphincter tone, no lesions  A: well woman Urge incontinence     P: mammogram pap smear guidelines reviewed Discussed incontinence types, pt seems to have mostly urgency.  We discussed dietary  modification and suggest she change to incontinence pads and can use A&D as skin protector.  Pt agreeable to try vesicare at 2.5mg  and will titrate up with symptoms.  F/u 55m-agrees counseled on breast self exam, mammography screening, adequate intake of calcium and vitamin D, diet and exercise return annually or prn Discussed PAP guideline changes, importance of weight bearing exercises, calcium, vit D and balanced diet.  An After Visit Summary was printed and given to the patient.

## 2013-10-05 NOTE — Patient Instructions (Addendum)

## 2013-10-20 ENCOUNTER — Ambulatory Visit
Admission: RE | Admit: 2013-10-20 | Discharge: 2013-10-20 | Disposition: A | Discharge status: Home / Self Care | Payer: Medicare Other | Source: Ambulatory Visit

## 2013-10-20 DIAGNOSIS — Z1231 Encounter for screening mammogram for malignant neoplasm of breast: Secondary | ICD-10-CM

## 2013-12-12 ENCOUNTER — Ambulatory Visit: Payer: Medicare Other | Admitting: Gynecology

## 2014-03-25 ENCOUNTER — Emergency Department (HOSPITAL_BASED_OUTPATIENT_CLINIC_OR_DEPARTMENT_OTHER)
Admission: EM | Admit: 2014-03-25 | Discharge: 2014-03-25 | Disposition: A | Discharge status: Home / Self Care | Payer: Medicare Other | Attending: Emergency Medicine | Admitting: Emergency Medicine

## 2014-03-25 ENCOUNTER — Encounter (HOSPITAL_BASED_OUTPATIENT_CLINIC_OR_DEPARTMENT_OTHER): Payer: Self-pay | Admitting: Emergency Medicine

## 2014-03-25 ENCOUNTER — Other Ambulatory Visit: Payer: Self-pay

## 2014-03-25 ENCOUNTER — Emergency Department (HOSPITAL_BASED_OUTPATIENT_CLINIC_OR_DEPARTMENT_OTHER): Payer: Medicare Other

## 2014-03-25 DIAGNOSIS — Z79899 Other long term (current) drug therapy: Secondary | ICD-10-CM | POA: Insufficient documentation

## 2014-03-25 DIAGNOSIS — Z8619 Personal history of other infectious and parasitic diseases: Secondary | ICD-10-CM | POA: Insufficient documentation

## 2014-03-25 DIAGNOSIS — Z792 Long term (current) use of antibiotics: Secondary | ICD-10-CM | POA: Insufficient documentation

## 2014-03-25 DIAGNOSIS — K648 Other hemorrhoids: Secondary | ICD-10-CM | POA: Insufficient documentation

## 2014-03-25 DIAGNOSIS — K589 Irritable bowel syndrome without diarrhea: Secondary | ICD-10-CM | POA: Insufficient documentation

## 2014-03-25 DIAGNOSIS — K219 Gastro-esophageal reflux disease without esophagitis: Secondary | ICD-10-CM | POA: Insufficient documentation

## 2014-03-25 DIAGNOSIS — E785 Hyperlipidemia, unspecified: Secondary | ICD-10-CM | POA: Insufficient documentation

## 2014-03-25 DIAGNOSIS — K644 Residual hemorrhoidal skin tags: Secondary | ICD-10-CM | POA: Insufficient documentation

## 2014-03-25 DIAGNOSIS — J189 Pneumonia, unspecified organism: Secondary | ICD-10-CM

## 2014-03-25 DIAGNOSIS — E039 Hypothyroidism, unspecified: Secondary | ICD-10-CM | POA: Insufficient documentation

## 2014-03-25 DIAGNOSIS — Z87448 Personal history of other diseases of urinary system: Secondary | ICD-10-CM | POA: Insufficient documentation

## 2014-03-25 DIAGNOSIS — J159 Unspecified bacterial pneumonia: Secondary | ICD-10-CM | POA: Insufficient documentation

## 2014-03-25 DIAGNOSIS — E7800 Pure hypercholesterolemia, unspecified: Secondary | ICD-10-CM | POA: Insufficient documentation

## 2014-03-25 DIAGNOSIS — Z87891 Personal history of nicotine dependence: Secondary | ICD-10-CM | POA: Insufficient documentation

## 2014-03-25 MED ORDER — LEVOFLOXACIN 750 MG PO TABS: 750.0000 mg | ORAL_TABLET | Freq: Every day | ORAL | Status: DC

## 2014-03-25 MED ORDER — LEVOFLOXACIN 750 MG PO TABS
750.0000 mg | ORAL_TABLET | Freq: Once | ORAL | Status: AC
  Administered 2014-03-25: 750 mg via ORAL
  Filled 2014-03-25: qty 1

## 2014-03-25 MED ORDER — BENZONATATE 100 MG PO CAPS: 100.0000 mg | ORAL_CAPSULE | Freq: Three times a day (TID) | ORAL | Status: DC | PRN

## 2014-03-25 MED ORDER — ONDANSETRON 4 MG PO TBDP: ORAL_TABLET | ORAL | Status: DC

## 2014-03-25 NOTE — ED Notes (Signed)
States pain is also in her left shoulder and back.

## 2014-03-25 NOTE — ED Notes (Signed)
Congested cough, fever, stomach spasms since Monday.  Chest pain, left arm pain, back pain last night.  Stated 'I didn't know if I would make it through the night.'  Reports was out of town, returned this evening, and is here for evaluation.  C/o left sided chest pain on triage.

## 2014-03-25 NOTE — Discharge Instructions (Signed)

## 2014-03-25 NOTE — ED Provider Notes (Signed)
CSN: [id number]     Arrival date & time 03/25/14  2037 History   First MD Initiated Contact with Patient 03/25/14 2137     Chief Complaint  Patient presents with  . Chest Pain     (Consider location/radiation/quality/duration/timing/severity/associated sxs/prior Treatment) HPI Comments: Patient presents with worsening cough. She states the cough started about 5 days ago. She's coughing up some yellowish sputum. She has had some subjective fevers. She's had some nausea but no vomiting. She has soreness across her left chest and left shoulder. She says is worse with coughing and with movement. She feels like it's sore from the coughing. She denies any pleuritic-type pain. She denies any leg pain or swelling. She has some shortness of breath at times. She denies any recent hospitalizations in the last 3 months. She's been using over-the-counter medicines without relief.   Patient is a 68 y.o. female presenting with chest pain.  Chest Pain Associated symptoms: cough, fatigue, fever, nausea and shortness of breath   Associated symptoms: no abdominal pain, no back pain, no diaphoresis, no dizziness, no headache, no numbness, not vomiting and no weakness     Past Medical History  Diagnosis Date  . Gastritis   . Helicobacter pylori gastritis   . Diverticulosis   . Esophageal stricture   . GERD (gastroesophageal reflux disease)   . IBS (irritable bowel syndrome)   . Internal hemorrhoids   . External hemorrhoids   . Hiatal hernia   . Hyperlipemia   . Thyroid disease   . Renal cyst   . Helicobacter pylori (H. pylori)   . Duodenal diverticulum   . Hypothyroid   . High cholesterol   . Diverticulosis 11/2003   Past Surgical History  Procedure Laterality Date  . Nose surgery      Deviated Septum   . Laparoscopy  1981  . Dilation and curettage of uterus      x 2   Family History  Problem Relation Age of Onset  . Colon polyps Brother   . Colon cancer      grandmother???  . COPD     . Heart disease Brother   . Lung cancer Father   . Osteoporosis Mother   . Osteoporosis Sister   . CVA Maternal Grandmother   . CVA Maternal Grandfather   . CVA Paternal Grandmother   . Heart attack Paternal Grandfather 54   History  Substance Use Topics  . Smoking status: Former Smoker    Quit date: 11/03/1966  . Smokeless tobacco: Never Used  . Alcohol Use: No   OB History   Grav Para Term Preterm Abortions TAB SAB Ect Mult Living   3 2 2  1  1   2      Review of Systems  Constitutional: Positive for fever and fatigue. Negative for chills and diaphoresis.  HENT: Negative for congestion, rhinorrhea and sneezing.   Eyes: Negative.   Respiratory: Positive for cough and shortness of breath. Negative for chest tightness.   Cardiovascular: Positive for chest pain. Negative for leg swelling.  Gastrointestinal: Positive for nausea. Negative for vomiting, abdominal pain, diarrhea and blood in stool.  Genitourinary: Negative for frequency, hematuria, flank pain and difficulty urinating.  Musculoskeletal: Negative for arthralgias and back pain.  Skin: Negative for rash.  Neurological: Negative for dizziness, speech difficulty, weakness, numbness and headaches.      Allergies  Codeine  Home Medications   Prior to Admission medications   Medication Sig Start Date End Date Taking? Authorizing  Provider  AMBULATORY NON FORMULARY MEDICATION Magnesium  Once daily    Historical Provider, MD  Ascorbic Acid (VITAMIN C PO) Take by mouth.    Historical Provider, MD  benzonatate (TESSALON PERLES) 100 MG capsule Take 1 capsule (100 mg total) by mouth 3 (three) times daily as needed for cough. 03/25/14   Malvin Johns, MD  Calcium Citrate-Vitamin D (CITRACAL + D PO) Take 1 tablet by mouth daily.      Historical Provider, MD  Cholecalciferol 1000 UNITS capsule Take 1,000 Units by mouth daily.      Historical Provider, MD  Coenzyme Q10 (CO Q-10) 100 MG CAPS Take 1 capsule by mouth daily.       Historical Provider, MD  CRESTOR 20 MG tablet Take 10 mg by mouth daily. One tablet by mouth once daily 10/09/11   Historical Provider, MD  docusate sodium (COLACE) 100 MG capsule Take 1 capsule (100 mg total) by mouth every 12 (twelve) hours. 08/07/13   Tanna Furry, MD  hydrocortisone (ANUSOL-HC) 25 MG suppository Place 1 suppository (25 mg total) rectally at bedtime. 08/12/13   Lafayette Dragon, MD  hyoscyamine (LEVSIN/SL) 0.125 MG SL tablet Dissolve 1 tablet under the tongue every 4-6 hours as needed for abdominal distention. 08/12/13   Lafayette Dragon, MD  levofloxacin (LEVAQUIN) 750 MG tablet Take 1 tablet (750 mg total) by mouth daily. X 6 days 03/25/14   Malvin Johns, MD  levothyroxine (SYNTHROID, LEVOTHROID) 88 MCG tablet Take 88 mcg by mouth daily.      Historical Provider, MD  Omega-3 Fatty Acids (OMEGA 3 PO) Take 1 capsule by mouth daily.      Historical Provider, MD  ondansetron (ZOFRAN ODT) 4 MG disintegrating tablet 4mg  ODT q4 hours prn nausea/vomit 03/25/14   Malvin Johns, MD  Probiotic Product (ALIGN) 4 MG CAPS Take 1 capsule by mouth 1 day or 1 dose. 10/15/11   Lafayette Dragon, MD  ranitidine (ZANTAC) 150 MG tablet Take 150 mg by mouth 2 (two) times daily.    Historical Provider, MD  solifenacin (VESICARE) 5 MG tablet 1/2 tablet by mouth daily 10/05/13   Azalia Bilis, MD   BP 121/56  Pulse 90  Temp(Src) 97.9 F (36.6 C) (Oral)  Resp 20  Ht 5\' 2"  (1.575 m)  Wt 134 lb (60.782 kg)  BMI 24.50 kg/m2  SpO2 96%  LMP 11/03/2000 Physical Exam  Constitutional: She is oriented to person, place, and time. She appears well-developed and well-nourished.  HENT:  Head: Normocephalic and atraumatic.  Mouth/Throat: Oropharynx is clear and moist.  Eyes: Pupils are equal, round, and reactive to light.  Neck: Normal range of motion. Neck supple.  Cardiovascular: Normal rate, regular rhythm and normal heart sounds.   Pulmonary/Chest: Effort normal and breath sounds normal. No respiratory distress.  She has no wheezes. She has no rales. She exhibits tenderness (reproducible tenderness on palpation of left chest wall).  Abdominal: Soft. Bowel sounds are normal. There is no tenderness. There is no rebound and no guarding.  Musculoskeletal: Normal range of motion. She exhibits no edema.  Lymphadenopathy:    She has no cervical adenopathy.  Neurological: She is alert and oriented to person, place, and time.  Skin: Skin is warm and dry. No rash noted.  Psychiatric: She has a normal mood and affect.    ED Course  Procedures (including critical care time) Labs Review Labs Reviewed - No data to display  Imaging Review Dg Chest 2 View  03/25/2014  CLINICAL DATA:  Fever and cough and chest congestion. Chest pain and left arm pain and back pain.  EXAM: CHEST  2 VIEW  COMPARISON:  Chest x-rays dated 08/07/2013 and 08/12/2012  FINDINGS: There is a focal infiltrate in the right upper lobe anteriorly consistent with pneumonia.  Lungs are otherwise clear. Heart size and vascularity are normal. No osseous abnormality. No effusions.  IMPRESSION: Right upper lobe pneumonia.   Electronically Signed   By: Rozetta Nunnery M.D.   On: 03/25/2014 21:31     EKG Interpretation None      MDM   Final diagnoses:  Community acquired pneumonia    Patient is well-appearing. She has no vomiting or hypoxia. I feel that she can have an outpatient trial of treatment of her pneumonia. She was given a dose of Levaquin in the ED and given a prescription to complete a seven-day course. I encouraged her to have close followup with her primary care physician and return here she has any worsening symptoms.    Malvin Johns, MD 03/26/14 0000

## 2014-04-19 ENCOUNTER — Encounter: Payer: Self-pay | Admitting: *Deleted

## 2014-04-20 ENCOUNTER — Encounter: Payer: Self-pay | Admitting: Cardiovascular Disease

## 2014-04-21 ENCOUNTER — Ambulatory Visit: Payer: Medicare Other | Admitting: Cardiovascular Disease

## 2014-05-30 ENCOUNTER — Ambulatory Visit (INDEPENDENT_AMBULATORY_CARE_PROVIDER_SITE_OTHER): Payer: Medicare Other | Admitting: Internal Medicine

## 2014-05-30 ENCOUNTER — Encounter: Payer: Self-pay | Admitting: Internal Medicine

## 2014-05-30 VITALS — BP 112/66 | HR 80 | Ht 62.0 in | Wt 135.6 lb

## 2014-05-30 DIAGNOSIS — K5900 Constipation, unspecified: Secondary | ICD-10-CM

## 2014-05-30 MED ORDER — POLYETHYLENE GLYCOL 3350 17 GM/SCOOP PO POWD: ORAL | Status: DC

## 2014-05-30 MED ORDER — LINACLOTIDE 145 MCG PO CAPS: 145.0000 ug | ORAL_CAPSULE | Freq: Every day | ORAL | Status: DC

## 2014-05-30 NOTE — Progress Notes (Signed)
CATERIN TABARES Mar 08, 1946 [id number]  Note: This dictation was prepared with Dragon digital system. Any transcriptional errors that result from this procedure are unintentional.   History of Present Illness: This is a 68 year old white female with chronic constipation and history of gastroesophageal reflux. Last office visit in October 2014 was for left lower quadrant abdominal pain was likely musculoskeletal. She had a last colonoscopy in January 2012 which showed external hemorrhoids. CT scan of the abdomen in December 2012 showed small cyst in the kidney but no active process. She has occasional right upper without abdominal pain. The ultrasound of the gallbladder in December 2013 showed vaginal stones in the bile duct. Subsequent MRCP was normal. Her main complaint today is constipation. She takes Metamucil and stool softeners on a regular basis. She has not taken any stronger laxatives. She is physically active and trying to eat a high-fiber diet    Past Medical History  Diagnosis Date  . Gastritis   . Helicobacter pylori gastritis   . Diverticulosis   . Esophageal stricture   . GERD (gastroesophageal reflux disease)   . IBS (irritable bowel syndrome)   . Internal hemorrhoids   . External hemorrhoids   . Hiatal hernia   . Hyperlipemia   . Thyroid disease   . Renal cyst   . Helicobacter pylori (H. pylori)   . Duodenal diverticulum   . Hypothyroid   . High cholesterol   . Diverticulosis 11/2003    Past Surgical History  Procedure Laterality Date  . Nose surgery      Deviated Septum   . Laparoscopy  1981  . Dilation and curettage of uterus      x 2    Allergies  Allergen Reactions  . Codeine Nausea Only    Family history and social history have been reviewed.  Review of Systems: Denies heartburn dysphagia rectal bleeding  The remainder of the 10 point ROS is negative except as outlined in the H&P  Physical Exam: General Appearance Well developed, in no  distress Eyes  Non icteric  HEENT  Non traumatic, normocephalic  Mouth No lesion, tongue papillated, no cheilosis Neck Supple without adenopathy, thyroid not enlarged, no carotid bruits, no JVD Lungs Clear to auscultation bilaterally COR Normal S1, normal S2, regular rhythm, no murmur, quiet precordium Abdomen soft relaxed abdomen with mild tenderness in right lower quadrant and left lower quadrant. No palpable mass or stool. No scars Rectal not done Extremities  No pedal edema Skin No lesions Neurological Alert and oriented x 3 Psychological Normal mood and affect  Assessment and Plan:  68 year old white female with functional constipation. We will try Linzess 145 mcg daily. If not effective or if too expensive she will start MiraLax 17 g daily and titrate the dose after several days to 9 g daily. As an alternative she may tract magnesium oxide or Senokot if other were laxatives don't work out   [employer] 05/30/2014

## 2014-05-30 NOTE — Patient Instructions (Addendum)
We have sent the following medications to your pharmacy for you to pick up at your convenience: Linzess 145 mcg daily Miralax 17 grams daily  Please choose between ONE of these 4 choices: Linzess 145 mcg once daily Miralax 17 grams (1 capful) daily x 2 days, then 1/2 capful (9 grams) daily thereafter Magnesium oxide 400 mg capsule-2 capsules every night (over the counter) Senokot-2 tablets by mouth every night (over the counter)  CC:Dr Anda Kraft

## 2014-07-26 ENCOUNTER — Ambulatory Visit (INDEPENDENT_AMBULATORY_CARE_PROVIDER_SITE_OTHER): Payer: Medicare Other | Admitting: Cardiovascular Disease

## 2014-07-26 ENCOUNTER — Encounter: Payer: Self-pay | Admitting: Cardiovascular Disease

## 2014-07-26 VITALS — BP 128/84 | Ht 62.0 in | Wt 137.3 lb

## 2014-07-26 DIAGNOSIS — E039 Hypothyroidism, unspecified: Secondary | ICD-10-CM

## 2014-07-26 DIAGNOSIS — E785 Hyperlipidemia, unspecified: Secondary | ICD-10-CM

## 2014-07-26 DIAGNOSIS — R079 Chest pain, unspecified: Secondary | ICD-10-CM

## 2014-07-26 DIAGNOSIS — R0789 Other chest pain: Secondary | ICD-10-CM

## 2014-07-26 NOTE — Patient Instructions (Signed)
Your physician recommends that you schedule a follow-up appointment in: As Needed  Your physician has requested that you have an exercise tolerance test. For further information please visit [email or web address]. Please also follow instruction sheet, as given.    

## 2014-07-26 NOTE — Progress Notes (Signed)
Patient ID: Victoria Peters, female   DOB: Oct 19, 1946, 68 y.o.   MRN: [id number]     HPI: Victoria Peters is a 68 y.o. female who presents to the office today for a cardiology followup evaluation.  I last saw her 3-1/2 years ago in May 2012.  Victoria Peters is followed by Dr. Gareth Eagle.  She has a history of hypothyroidism and also history of hyperlipidemia.  I had seen her over 3 years ago, when she developed some atypical chest pain.  This seemed more musculoskeletal in etiology.  At that time, she reported having had a routine treadmill test over 5 years prior to that evaluation.  She was scheduled for subsequent exercise Myoview scan.  However, she never had this done.  Over the past 3 years, she has remained fairly stable.  Recently, she has noted several episodes of sharp, left-sided chest discomfort, which at times, may radiate to her back.  She denies any clearcut exertional precipitation.  She denies any exertional dyspnea.  Family history is notable in that her brother died suddenly at age 81 from presumed heart disease, although this was never fully evaluated.  She admits to remaining active but does not routinely exercise.  She has had chronic issues with constipation.  Past Medical History  Diagnosis Date  . Gastritis   . Helicobacter pylori gastritis   . Diverticulosis   . Esophageal stricture   . GERD (gastroesophageal reflux disease)   . IBS (irritable bowel syndrome)   . Internal hemorrhoids   . External hemorrhoids   . Hiatal hernia   . Hyperlipemia   . Thyroid disease   . Renal cyst   . Helicobacter pylori (H. pylori)   . Duodenal diverticulum   . Hypothyroid   . High cholesterol   . Diverticulosis 11/2003    Past Surgical History  Procedure Laterality Date  . Nose surgery      Deviated Septum   . Laparoscopy  1981  . Dilation and curettage of uterus      x 2    Allergies  Allergen Reactions  . Codeine Nausea Only    Current Outpatient Prescriptions   Medication Sig Dispense Refill  . cholecalciferol (VITAMIN D) 1000 UNITS tablet Take 2,000 Units by mouth daily.      . CRESTOR 20 MG tablet Take 10 mg by mouth daily. 2-3  Times per week      . hydrocortisone (ANUSOL-HC) 25 MG suppository Place 1 suppository (25 mg total) rectally at bedtime.  12 suppository  1  . hyoscyamine (LEVSIN/SL) 0.125 MG SL tablet Dissolve 1 tablet under the tongue every 4-6 hours as needed for abdominal distention.  60 tablet  2  . levothyroxine (SYNTHROID, LEVOTHROID) 88 MCG tablet Take 88 mcg by mouth daily.        . Magnesium 250 MG TABS Take 2 tablets by mouth daily.      . magnesium oxide (MAG-OX) 400 MG tablet Take 400 mg by mouth daily.      . Melatonin 2.5-338 MG-MCG SUBL Place 1 tablet under the tongue at bedtime.      . Omega-3 Fatty Acids (OMEGA 3 PO) Take 1 capsule by mouth daily.        . polyethylene glycol powder (GLYCOLAX/MIRALAX) powder Take 17 grams daily x 2 days, then take 9 grams thereafter  527 g  1  . Probiotic Product (PROBIOTIC DAILY PO) Take by mouth.      . vitamin C (ASCORBIC ACID) 500  MG tablet Take 500 mg by mouth daily.       No current facility-administered medications for this visit.    History   Social History  . Marital Status: Married    Spouse Name: N/A    Number of Children: N/A  . Years of Education: N/A   Occupational History  . Not on file.   Social History Main Topics  . Smoking status: Former Smoker    Quit date: 11/03/1966  . Smokeless tobacco: Never Used  . Alcohol Use: No  . Drug Use: No  . Sexual Activity: Not on file   Other Topics Concern  . Not on file   Social History Narrative  . No narrative on file   Social history is notable in that she completed 11th grade of education.  She has 2 children and 4 grandchildren.  There is no EtOH use.  Family History  Problem Relation Age of Onset  . Colon polyps Brother   . Colon cancer      grandmother???  . COPD    . Heart disease Brother   . Lung  cancer Father   . Osteoporosis Mother   . Osteoporosis Sister   . CVA Maternal Grandmother   . CVA Maternal Grandfather   . CVA Paternal Grandmother   . Heart attack Paternal Grandfather 44    ROS General: Negative; No fevers, chills, or night sweats HEENT: Negative; No changes in vision or hearing, sinus congestion, difficulty swallowing Pulmonary: Negative; No cough, wheezing, shortness of breath, hemoptysis Cardiovascular: See HPI: No chest pain, presyncope, syncope, palpatations GI: Positive for constipation No nausea, vomiting, diarrhea, or abdominal pain GU: Negative; No dysuria, hematuria, or difficulty voiding Musculoskeletal: Negative; no myalgias, joint pain, or weakness Hematologic: Negative; no easy bruising, bleeding Endocrine: Positive for hypothyroidism; no diabetes, history of vitamin D insufficiency Neuro: Negative; no changes in balance, headaches Skin: Negative; No rashes or skin lesions Psychiatric: Negative; No behavioral problems, depression Sleep: Negative; No snoring,  daytime sleepiness, hypersomnolence, bruxism, restless legs, hypnogognic hallucinations. Other comprehensive 14 point system review is negative   Physical Exam BP 128/84  Ht 5\' 2"  (1.575 m)  Wt 137 lb 4.8 oz (62.279 kg)  BMI 25.11 kg/m2  LMP 11/03/2000 General: Alert, oriented, no distress.  Skin: normal turgor, no rashes, warm and dry HEENT: Normocephalic, atraumatic. Pupils equal round and reactive to light; sclera anicteric; extraocular muscles intact, No lid lag; Nose without nasal septal hypertrophy; Mouth/Parynx benign; Mallinpatti scale 2 Neck: No JVD, no carotid bruits; normal carotid upstroke Lungs: clear to ausculatation and percussion bilaterally; no wheezing or rales, normal inspiratory and expiratory effort Chest wall: without tenderness to palpitation Heart: PMI not displaced, RRR, s1 s2 normal, very faint 1/6 systolic murmur, No diastolic murmur, no rubs, gallops, thrills, or  heaves Abdomen: soft, nontender; no hepatosplenomehaly, BS+; abdominal aorta nontender and not dilated by palpation. Back: no CVA tenderness Pulses: 2+  Musculoskeletal: full range of motion, normal strength, no joint deformities Extremities: Pulses 2+, no clubbing cyanosis or edema, Homan's sign negative  Neurologic: grossly nonfocal; Cranial nerves grossly wnl Psychologic: Normal mood and affect   ECG (independently read by me): Normal sinus rhythm at 78 beats per minute.  No ectopy.  QTc interval slightly increased at 460 ms.  LABS:  BMET    Component Value Date/Time   NA 142 08/07/2013 0920   K 3.8 08/07/2013 0920   CL 108 08/07/2013 0920   CO2 28 08/07/2013 0920   GLUCOSE 88 08/07/2013  0920   BUN 17 08/07/2013 0920   CREATININE 0.90 08/07/2013 0920   CALCIUM 9.7 08/07/2013 0920   GFRNONAA 64* 08/07/2013 0920   GFRAA 74* 08/07/2013 0920     Hepatic Function Panel     Component Value Date/Time   PROT 6.6 08/07/2013 0920   ALBUMIN 4.2 08/07/2013 0920   AST 24 08/07/2013 0920   ALT 21 08/07/2013 0920   ALKPHOS 84 08/07/2013 0920   BILITOT 0.6 08/07/2013 0920     CBC    Component Value Date/Time   WBC 5.1 08/07/2013 0920   RBC 4.94 08/07/2013 0920   HGB 14.9 08/07/2013 0920   HCT 44.3 08/07/2013 0920   PLT 216 08/07/2013 0920   MCV 89.7 08/07/2013 0920   MCH 30.2 08/07/2013 0920   MCHC 33.6 08/07/2013 0920   RDW 12.7 08/07/2013 0920   LYMPHSABS 1.1 08/07/2013 0920   MONOABS 0.5 08/07/2013 0920   EOSABS 0.2 08/07/2013 0920   BASOSABS 0.0 08/07/2013 0920     BNP No results found for this basename: probnp    Lipid Panel  No results found for this basename: chol, trig, hdl, cholhdl, vldl, ldlcalc, ldldirect     RADIOLOGY: No results found.    ASSESSMENT AND PLAN: Victoria Peters is a 68 year old female with at least an 56 year history of hypothyroidism, as well as a greater than ten-year history of hyperlipidemia.  She has noticed occasional episodes of somewhat  atypical chest pain.  Family history is notable that her brother died at age 11 suddenly and this was presumed of cardiac etiology although not definitively evaluated.  She had previously undergone a routine treadmill test approximately 10 years ago.  Her ECG today is unremarkable.  She tells me Dr. Wilson Singer will be checking a complete set of laboratory the last that these be sent to me for my review.  In light of her family history, and her episodes of chest pain.  I have recommended that she undergo a followup routine graded exercise treadmill study.  This will be scheduled at her convenience for further evaluation of potential coronary obstructive disease.  We will notify her with the results.  Once complete, and if normal, I will be available to see her back on an as-needed basis.  If abnormal, further recommendations will be made at that time.     Troy Sine, MD, [facility]  07/26/2014 1:37 PM

## 2014-08-10 ENCOUNTER — Encounter (HOSPITAL_COMMUNITY): Payer: Medicare Other

## 2014-08-13 ENCOUNTER — Other Ambulatory Visit: Payer: Self-pay | Admitting: Internal Medicine

## 2014-08-23 ENCOUNTER — Telehealth (HOSPITAL_COMMUNITY): Payer: Self-pay

## 2014-08-23 NOTE — Telephone Encounter (Signed)
Encounter complete. 

## 2014-08-29 ENCOUNTER — Encounter (HOSPITAL_COMMUNITY): Payer: Medicare Other

## 2014-09-04 ENCOUNTER — Encounter: Payer: Self-pay | Admitting: Cardiovascular Disease

## 2014-09-22 ENCOUNTER — Telehealth: Payer: Self-pay | Admitting: Gynecology

## 2014-09-22 NOTE — Telephone Encounter (Signed)
Left message regarding cancelled aex.

## 2014-10-06 ENCOUNTER — Ambulatory Visit: Payer: Medicare Other | Admitting: Gynecology

## 2014-11-15 ENCOUNTER — Ambulatory Visit (INDEPENDENT_AMBULATORY_CARE_PROVIDER_SITE_OTHER): Payer: Medicare Other | Admitting: Nurse Practitioner

## 2014-11-15 ENCOUNTER — Encounter: Payer: Self-pay | Admitting: Nurse Practitioner

## 2014-11-15 VITALS — BP 120/78 | HR 76 | Ht 62.0 in | Wt 138.0 lb

## 2014-11-15 DIAGNOSIS — Z01419 Encounter for gynecological examination (general) (routine) without abnormal findings: Secondary | ICD-10-CM

## 2014-11-15 NOTE — Progress Notes (Signed)
Patient ID: Victoria Peters, female   DOB: 1946-05-27, 69 y.o.   MRN: [id number] 69 y.o. [id number] Married  Caucasian Fe here for annual exam.  Current health problems with esophageal stricture and increase in GERD.  Has apt. to see Dr. Olevia Perches in Feb.  Patient's last menstrual period was 11/03/2000.          Sexually active: No.  The current method of family planning is abstinence and post menopausal status.    Exercising: No.  The patient does not participate in regular exercise at present. Smoker:  no  Health Maintenance: Pap:  06/26/10, negative MMG:  10/20/13, Bi-Rads 1:  Negative  Colonoscopy:  11/19/10,hemorrhoids, mild diverticulosis, repeat in 10 years BMD:   2013 at [place]:  Dr. Wilson Singer Shingles: 2010 Prevnar 13: 08/2014 Labs:  Dr. Wilson Singer   reports that she quit smoking about 48 years ago. She has never used smokeless tobacco. She reports that she does not drink alcohol or use illicit drugs.  Past Medical History  Diagnosis Date  . Gastritis   . Helicobacter pylori gastritis   . Esophageal stricture   . GERD (gastroesophageal reflux disease)   . IBS (irritable bowel syndrome)   . Internal hemorrhoids   . External hemorrhoids   . Hiatal hernia   . Hyperlipemia   . Thyroid disease   . Renal cyst   . Duodenal diverticulum   . Hypothyroid   . Diverticulosis 11/2003  . Hypothyroid 48    Past Surgical History  Procedure Laterality Date  . Nose surgery      Deviated Septum   . Laparoscopy  1981  . Dilation and curettage of uterus      x 2    Current Outpatient Prescriptions  Medication Sig Dispense Refill  . ANUCORT-HC 25 MG suppository INSERT 1 SUPPOSITORY RECTALLY AT BEDTIME 12 suppository 0  . cholecalciferol (VITAMIN D) 1000 UNITS tablet Take 2,000 Units by mouth daily.    . CRESTOR 20 MG tablet Take 10 mg by mouth daily. 2-3  Times per week    . hyoscyamine (LEVSIN/SL) 0.125 MG SL tablet Dissolve 1 tablet under the tongue every 4-6 hours as needed for  abdominal distention. 60 tablet 2  . levothyroxine (SYNTHROID, LEVOTHROID) 88 MCG tablet Take 88 mcg by mouth daily.      . magnesium oxide (MAG-OX) 400 MG tablet Take 400 mg by mouth daily.    . Omega-3 Fatty Acids (OMEGA 3 PO) Take 1 capsule by mouth daily.      . Probiotic Product (PROBIOTIC DAILY PO) Take by mouth.    . vitamin C (ASCORBIC ACID) 500 MG tablet Take 500 mg by mouth daily.     No current facility-administered medications for this visit.    Family History  Problem Relation Age of Onset  . Colon polyps Brother   . Colon cancer      grandmother???  . COPD    . Heart disease Brother   . Lung cancer Father   . Osteoporosis Mother   . Osteoporosis Sister   . CVA Maternal Grandmother   . CVA Maternal Grandfather   . CVA Paternal Grandmother   . Heart attack Paternal Grandfather 69    ROS:  Pertinent items are noted in HPI.  Otherwise, a comprehensive ROS was negative.  Exam:   BP 120/78 mmHg  Pulse 76  Ht 5\' 2"  (1.575 m)  Wt 138 lb (62.596 kg)  BMI 25.23 kg/m2  LMP 11/03/2000 Height: 5\' 2"  (  157.5 cm) Ht Readings from Last 3 Encounters:  11/15/14 5\' 2"  (1.575 m)  07/26/14 5\' 2"  (1.575 m)  05/30/14 5\' 2"  (1.575 m)    General appearance: alert, cooperative and appears stated age Head: Normocephalic, without obvious abnormality, atraumatic Neck: no adenopathy, supple, symmetrical, trachea midline and thyroid normal to inspection and palpation Lungs: clear to auscultation bilaterally Breasts: normal appearance, no masses or tenderness Heart: regular rate and rhythm Abdomen: soft, non-tender; no masses,  no organomegaly Extremities: extremities normal, atraumatic, no cyanosis or edema Skin: Skin color, texture, turgor normal. No rashes or lesions Lymph nodes: Cervical, supraclavicular, and axillary nodes normal. No abnormal inguinal nodes palpated Neurologic: Grossly normal   Pelvic: External genitalia:  no lesions              Urethra:  normal appearing  urethra with no masses, tenderness or lesions              Bartholin's and Skene's: normal                 Vagina: normal appearing vagina with normal color and discharge, no lesions              Cervix: anteverted              Pap taken: No. Bimanual Exam:  Uterus:  normal size, contour, position, consistency, mobility, non-tender              Adnexa: no mass, fullness, tenderness               Rectovaginal: Confirms               Anus:  normal sphincter tone, no lesions  Chaperone present:  no  A:  Well Woman with normal exam  Postmenopausal no HRT  Urge incontinence  Hypothyroid   Diverticulosis and history of esophageal stricture with GERD   P:   Reviewed health and wellness pertinent to exam  Pap smear not taken today  Mammogram is due now and will schedule  Counseled on breast self exam, mammography screening, adequate intake of calcium and vitamin D, diet and exercise, Kegel's exercises return annually or prn  An After Visit Summary was printed and given to the patient.

## 2014-11-15 NOTE — Patient Instructions (Signed)

## 2014-11-19 NOTE — Progress Notes (Signed)
Encounter reviewed by Dr. Brook Silva.  

## 2014-11-29 DIAGNOSIS — H2513 Age-related nuclear cataract, bilateral: Secondary | ICD-10-CM | POA: Diagnosis not present

## 2014-12-19 ENCOUNTER — Ambulatory Visit: Payer: Self-pay | Admitting: Internal Medicine

## 2014-12-19 ENCOUNTER — Other Ambulatory Visit: Payer: Self-pay

## 2014-12-19 DIAGNOSIS — Z1231 Encounter for screening mammogram for malignant neoplasm of breast: Secondary | ICD-10-CM

## 2014-12-22 DIAGNOSIS — H2511 Age-related nuclear cataract, right eye: Secondary | ICD-10-CM | POA: Diagnosis not present

## 2015-01-04 ENCOUNTER — Ambulatory Visit: Payer: Self-pay

## 2015-01-08 DIAGNOSIS — H25811 Combined forms of age-related cataract, right eye: Secondary | ICD-10-CM | POA: Diagnosis not present

## 2015-01-08 DIAGNOSIS — H2511 Age-related nuclear cataract, right eye: Secondary | ICD-10-CM | POA: Diagnosis not present

## 2015-01-11 ENCOUNTER — Ambulatory Visit: Payer: Self-pay

## 2015-01-15 DIAGNOSIS — H2512 Age-related nuclear cataract, left eye: Secondary | ICD-10-CM | POA: Diagnosis not present

## 2015-01-17 ENCOUNTER — Other Ambulatory Visit (INDEPENDENT_AMBULATORY_CARE_PROVIDER_SITE_OTHER): Payer: Medicare Other

## 2015-01-17 ENCOUNTER — Encounter: Payer: Self-pay | Admitting: Internal Medicine

## 2015-01-17 ENCOUNTER — Ambulatory Visit (INDEPENDENT_AMBULATORY_CARE_PROVIDER_SITE_OTHER): Payer: Medicare Other | Admitting: Internal Medicine

## 2015-01-17 VITALS — BP 110/68 | HR 102 | Ht 63.0 in | Wt 140.0 lb

## 2015-01-17 DIAGNOSIS — R140 Abdominal distension (gaseous): Secondary | ICD-10-CM

## 2015-01-17 DIAGNOSIS — R1084 Generalized abdominal pain: Secondary | ICD-10-CM | POA: Diagnosis not present

## 2015-01-17 LAB — BASIC METABOLIC PANEL
BUN: 22 mg/dL (ref 6–23)
CO2: 29 meq/L (ref 19–32)
CREATININE: 0.84 mg/dL (ref 0.40–1.20)
Calcium: 9 mg/dL (ref 8.4–10.5)
Chloride: 104 mEq/L (ref 96–112)
GFR: 71.22 mL/min (ref >60.00)
Glucose, Bld: 107 mg/dL — ABNORMAL HIGH (ref 70–99)
Potassium: 3.7 mEq/L (ref 3.5–5.1)
SODIUM: 137 meq/L (ref 135–145)

## 2015-01-17 MED ORDER — HYOSCYAMINE SULFATE 0.125 MG SL SUBL: SUBLINGUAL_TABLET | SUBLINGUAL | Status: DC

## 2015-01-17 NOTE — Progress Notes (Signed)
Victoria Peters 05-Aug-1946 [id number]  Note: This dictation was prepared with Dragon digital system. Any transcriptional errors that result from this procedure are unintentional.   History of Present Illness: This is a 69 year old white female with history of constipation and bloating. Today her main complaint is abdominal distention and fullness and almost continuous abdominal discomfort. Her weight has been stable around 138- 140 pounds and she has been having regular  bowel movements as a result of taking over-the-counter magnesium pills usually 1-70 times a week. She denies being constipated. Last appointment in July 2015 with the same complaint of abdominal pain. We prescribed Victoria Peters service but she didn't take it because of high cost. Last colonoscopy January 2012 showed external hemorrhoids. CT scan of the abdomen at that time showed renal cyst. Abdominal ultrasound in December 2013 with subsequent MRCP showed a duodenal diverticulum, 5 mm common bile duct. No stones in common bile duct her gallbladder appeared normal, she has had intermittent low grade hematochezia which he attributes to hemorrhoids     Past Medical History  Diagnosis Date  . Gastritis   . Helicobacter pylori gastritis   . Esophageal stricture   . GERD (gastroesophageal reflux disease)   . IBS (irritable bowel syndrome)   . Internal hemorrhoids   . External hemorrhoids   . Hiatal hernia   . Hyperlipemia   . Thyroid disease   . Renal cyst   . Duodenal diverticulum   . Hypothyroid   . Diverticulosis 11/2003  . Hypothyroid 34    Past Surgical History  Procedure Laterality Date  . Nose surgery      Deviated Septum   . Laparoscopy  1981  . Dilation and curettage of uterus      x 2    Allergies  Allergen Reactions  . Codeine Nausea Only    Family history and social history have been reviewed.  Review of Systems: Rectal bleeding. Abdominal distention and abdominal pain  The remainder of the 10 point ROS  is negative except as outlined in the H&P  Physical Exam: General Appearance Well developed, in no distress Eyes  Non icteric  HEENT  Non traumatic, normocephalic  Mouth No lesion, tongue papillated, no cheilosis Neck Supple without adenopathy, thyroid not enlarged, no carotid bruits, no JVD Lungs Clear to auscultation bilaterally COR Normal S1, normal S2, regular rhythm, no murmur, quiet precordium Abdomen mildly protuberant but soft with minimal tenderness throughout. Normoactive bowel sounds no tympany. No rebound or fullness. Liver edge at costal margin Rectal normal rectal sphincter tone. Hemoccult negative soft stool. No impaction  Extremities  No pedal edema Skin No lesions Neurological Alert and oriented x 3 Psychological Normal mood and affect  Assessment and Plan:   69 year old white female with that dyspepsia and  bloating likely secondary to functional cause. She likely has an irritable bowel syndrome. She is postmenopausal. We will proceed with CT scan of the abdomen and pelvis to rule out intra-abdominal malignancy. We will start Levsin sublingually 0.125 mg when necessary crampy abdominal pain. She will use over-the-counter Preparation H because Anusol HC suppositories were too expensive    Victoria Peters 01/17/2015

## 2015-01-17 NOTE — Patient Instructions (Addendum)
Dr Kohut Continue PProbiotics,  Levsin SL .125 mg as needed  You have been scheduled for a CT scan of the abdomen and pelvis at [facility] ([address]---this is in the same building as [facility]).   You are scheduled on 01/24/2015 at 1:30pm. You should arrive 15 minutes prior to your appointment time for registration. Please follow the written instructions below on the day of your exam:  WARNING: IF YOU ARE ALLERGIC TO IODINE/X-RAY DYE, PLEASE NOTIFY RADIOLOGY IMMEDIATELY AT [phone]! YOU WILL BE GIVEN A 13 HOUR PREMEDICATION PREP.  1) Do not eat or drink anything after 9:30am (4 hours prior to your test) 2) You have been given 2 bottles of oral contrast to drink. The solution may taste better if refrigerated, but do NOT add ice or any other liquid to this solution. Shake well before drinking.    Drink 1 bottle of contrast @ 11:30am (2 hours prior to your exam)  Drink 1 bottle of contrast @ 12:30pm (1 hour prior to your exam)  You may take any medications as prescribed with a small amount of water except for the following: Metformin, Glucophage, Glucovance, Avandamet, Riomet, Fortamet, Actoplus Met, Janumet, Glumetza or Metaglip. The above medications must be held the day of the exam AND 48 hours after the exam.  The purpose of you drinking the oral contrast is to aid in the visualization of your intestinal tract. The contrast solution may cause some diarrhea. Before your exam is started, you will be given a small amount of fluid to drink. Depending on your individual set of symptoms, you may also receive an intravenous injection of x-ray contrast/dye. Plan on being at [facility] for 30 minutes or long, depending on the type of exam you are having performed.  If you have any questions regarding your exam or if you need to reschedule, you may call the CT department at [phone] between the hours of 8:00 am and 5:00 pm,  Monday-Friday.  ________________________________________________________________________   

## 2015-01-18 ENCOUNTER — Ambulatory Visit: Payer: Self-pay

## 2015-01-22 DIAGNOSIS — H25812 Combined forms of age-related cataract, left eye: Secondary | ICD-10-CM | POA: Diagnosis not present

## 2015-01-22 DIAGNOSIS — H2512 Age-related nuclear cataract, left eye: Secondary | ICD-10-CM | POA: Diagnosis not present

## 2015-01-24 ENCOUNTER — Ambulatory Visit (INDEPENDENT_AMBULATORY_CARE_PROVIDER_SITE_OTHER)
Admission: RE | Admit: 2015-01-24 | Discharge: 2015-01-24 | Disposition: A | Discharge status: Home / Self Care | Payer: Medicare Other | Source: Ambulatory Visit | Attending: Internal Medicine | Admitting: Internal Medicine

## 2015-01-24 DIAGNOSIS — N281 Cyst of kidney, acquired: Secondary | ICD-10-CM | POA: Diagnosis not present

## 2015-01-24 DIAGNOSIS — R140 Abdominal distension (gaseous): Secondary | ICD-10-CM

## 2015-01-24 DIAGNOSIS — R1084 Generalized abdominal pain: Secondary | ICD-10-CM

## 2015-01-24 MED ORDER — IOHEXOL 300 MG/ML  SOLN
100.0000 mL | Freq: Once | INTRAMUSCULAR | Status: AC | PRN
  Administered 2015-01-24: 100 mL via INTRAVENOUS

## 2015-03-16 DIAGNOSIS — Z79899 Other long term (current) drug therapy: Secondary | ICD-10-CM | POA: Diagnosis not present

## 2015-03-16 DIAGNOSIS — E789 Disorder of lipoprotein metabolism, unspecified: Secondary | ICD-10-CM | POA: Diagnosis not present

## 2015-03-20 ENCOUNTER — Ambulatory Visit: Payer: Self-pay

## 2015-03-20 ENCOUNTER — Ambulatory Visit
Admission: RE | Admit: 2015-03-20 | Discharge: 2015-03-20 | Disposition: A | Discharge status: Home / Self Care | Payer: Medicare Other | Source: Ambulatory Visit

## 2015-03-20 DIAGNOSIS — Z1231 Encounter for screening mammogram for malignant neoplasm of breast: Secondary | ICD-10-CM | POA: Diagnosis not present

## 2015-03-28 DIAGNOSIS — E039 Hypothyroidism, unspecified: Secondary | ICD-10-CM | POA: Diagnosis not present

## 2015-03-28 DIAGNOSIS — E780 Pure hypercholesterolemia: Secondary | ICD-10-CM | POA: Diagnosis not present

## 2015-04-24 DIAGNOSIS — J329 Chronic sinusitis, unspecified: Secondary | ICD-10-CM | POA: Diagnosis not present

## 2015-04-24 DIAGNOSIS — E032 Hypothyroidism due to medicaments and other exogenous substances: Secondary | ICD-10-CM | POA: Diagnosis not present

## 2015-07-05 DIAGNOSIS — N390 Urinary tract infection, site not specified: Secondary | ICD-10-CM | POA: Diagnosis not present

## 2015-07-05 DIAGNOSIS — N938 Other specified abnormal uterine and vaginal bleeding: Secondary | ICD-10-CM | POA: Diagnosis not present

## 2015-07-26 DIAGNOSIS — E039 Hypothyroidism, unspecified: Secondary | ICD-10-CM | POA: Diagnosis not present

## 2015-07-26 DIAGNOSIS — E789 Disorder of lipoprotein metabolism, unspecified: Secondary | ICD-10-CM | POA: Diagnosis not present

## 2015-07-26 DIAGNOSIS — R05 Cough: Secondary | ICD-10-CM | POA: Diagnosis not present

## 2015-08-17 DIAGNOSIS — Z23 Encounter for immunization: Secondary | ICD-10-CM | POA: Diagnosis not present

## 2015-08-21 DIAGNOSIS — D2261 Melanocytic nevi of right upper limb, including shoulder: Secondary | ICD-10-CM | POA: Diagnosis not present

## 2015-08-21 DIAGNOSIS — B078 Other viral warts: Secondary | ICD-10-CM | POA: Diagnosis not present

## 2015-08-21 DIAGNOSIS — D225 Melanocytic nevi of trunk: Secondary | ICD-10-CM | POA: Diagnosis not present

## 2015-08-21 DIAGNOSIS — L821 Other seborrheic keratosis: Secondary | ICD-10-CM | POA: Diagnosis not present

## 2015-11-13 DIAGNOSIS — E039 Hypothyroidism, unspecified: Secondary | ICD-10-CM | POA: Diagnosis not present

## 2015-11-13 DIAGNOSIS — E789 Disorder of lipoprotein metabolism, unspecified: Secondary | ICD-10-CM | POA: Diagnosis not present

## 2015-11-13 DIAGNOSIS — E559 Vitamin D deficiency, unspecified: Secondary | ICD-10-CM | POA: Diagnosis not present

## 2015-11-20 ENCOUNTER — Ambulatory Visit (INDEPENDENT_AMBULATORY_CARE_PROVIDER_SITE_OTHER): Payer: Medicare Other | Admitting: Nurse Practitioner

## 2015-11-20 ENCOUNTER — Encounter: Payer: Self-pay | Admitting: Nurse Practitioner

## 2015-11-20 VITALS — BP 118/72 | HR 64 | Ht 61.75 in | Wt 130.0 lb

## 2015-11-20 DIAGNOSIS — K2100 Gastro-esophageal reflux disease with esophagitis, without bleeding: Secondary | ICD-10-CM

## 2015-11-20 DIAGNOSIS — Z01419 Encounter for gynecological examination (general) (routine) without abnormal findings: Secondary | ICD-10-CM

## 2015-11-20 DIAGNOSIS — Z0000 Encounter for general adult medical examination without abnormal findings: Secondary | ICD-10-CM | POA: Diagnosis not present

## 2015-11-20 DIAGNOSIS — Z205 Contact with and (suspected) exposure to viral hepatitis: Secondary | ICD-10-CM | POA: Diagnosis not present

## 2015-11-20 NOTE — Progress Notes (Signed)
Patient ID: Victoria Peters, female   DOB: December 20, 1945, 70 y.o.   MRN: [id number]  70 y.o. [id number] Married  Caucasian Fe here for annual exam.  No new problems except for continued upper GI fullness, bloating, GERD.  She has some hemorrhoid issues but no real flare. Mother age 53 has passed.   Patient's last menstrual period was 11/03/2000 (approximate).          Sexually active: No.  The current method of family planning is none.    Exercising: No.  The patient does not participate in regular exercise at present. Smoker:  no  Health Maintenance: Pap: 06/26/10, negative MMG:03/20/15, 3D, Bi-Rads 1: Negative  Colonoscopy: 11/19/10, hemorrhoids, mild diverticulosis, repeat in 10 years BMD: 2013 at Dr. Wilson Singer TDaP: Dr. Wilson Singer Shingles: 2010 Pneumonia: (Prevnar 13) 08/2014 Hep C - will get today Labs: Dr. Wilson Singer 11/2015   reports that she quit smoking about 49 years ago. She has never used smokeless tobacco. She reports that she does not drink alcohol or use illicit drugs.  Past Medical History  Diagnosis Date  . Gastritis   . Helicobacter pylori gastritis   . Esophageal stricture   . GERD (gastroesophageal reflux disease)   . IBS (irritable bowel syndrome)   . Internal hemorrhoids   . External hemorrhoids   . Hiatal hernia   . Hyperlipemia   . Thyroid disease   . Renal cyst   . Duodenal diverticulum   . Hypothyroid   . Diverticulosis 11/2003  . Hypothyroid 27    Past Surgical History  Procedure Laterality Date  . Nose surgery      Deviated Septum   . Laparoscopy  1981  . Dilation and curettage of uterus      x 2    Current Outpatient Prescriptions  Medication Sig Dispense Refill  . ANUCORT-HC 25 MG suppository INSERT 1 SUPPOSITORY RECTALLY AT BEDTIME 12 suppository 0  . cholecalciferol (VITAMIN D) 1000 UNITS tablet Take 2,000 Units by mouth daily.    . CRESTOR 20 MG tablet Take 10 mg by mouth daily. 2-3  Times per week    . hyoscyamine (LEVSIN SL) 0.125 MG SL tablet  Take one tablet sl every 4-6 hours as needed for Abdominal pain and cramping 30 tablet 0  . levothyroxine (SYNTHROID, LEVOTHROID) 88 MCG tablet Take 88 mcg by mouth daily.      . Omega-3 Fatty Acids (OMEGA 3 PO) Take 1 capsule by mouth daily.      . Probiotic Product (PROBIOTIC DAILY PO) Take by mouth.    . vitamin C (ASCORBIC ACID) 500 MG tablet Take 500 mg by mouth daily.     No current facility-administered medications for this visit.    Family History  Problem Relation Age of Onset  . Colon polyps Brother   . Colon cancer      grandmother???  . COPD    . Heart disease Brother   . Lung cancer Father   . Osteoporosis Mother   . Osteoporosis Sister   . CVA Maternal Grandmother   . CVA Maternal Grandfather   . CVA Paternal Grandmother   . Heart attack Paternal Grandfather 35    ROS:  Pertinent items are noted in HPI.  Otherwise, a comprehensive ROS was negative.  Exam:   BP 118/72 mmHg  Pulse 64  Ht 5' 1.75" (1.568 m)  Wt 130 lb (58.968 kg)  BMI 23.98 kg/m2  LMP 11/03/2000 (Approximate) Height: 5' 1.75" (156.8 cm) Ht Readings from Last 3  Encounters:  11/20/15 5' 1.75" (1.568 m)  01/17/15 5\' 3"  (1.6 m)  11/15/14 5\' 2"  (1.575 m)    General appearance: alert, cooperative and appears stated age Head: Normocephalic, without obvious abnormality, atraumatic Neck: no adenopathy, supple, symmetrical, trachea midline and thyroid normal to inspection and palpation Lungs: clear to auscultation bilaterally Breasts: normal appearance, no masses or tenderness Heart: regular rate and rhythm Abdomen: soft, non-tender; no masses,  no organomegaly Extremities: extremities normal, atraumatic, no cyanosis or edema Skin: Skin color, texture, turgor normal. No rashes or lesions Lymph nodes: Cervical, supraclavicular, and axillary nodes normal. No abnormal inguinal nodes palpated Neurologic: Grossly normal   Pelvic: External genitalia:  no lesions              Urethra:  normal  appearing urethra with no masses, tenderness or lesions              Bartholin's and Skene's: normal                 Vagina: normal appearing vagina with normal color and discharge, no lesions              Cervix: anteverted              Pap taken: No. Bimanual Exam:  Uterus:  normal size, contour, position, consistency, mobility, non-tender              Adnexa: no mass, fullness, tenderness               Rectovaginal: Confirms               Anus:  normal sphincter tone, no lesions  Chaperone present: yes  A:  Well Woman with normal exam  Postmenopausal no HRT Urge incontinence Hypothyroid  Diverticulosis and history of esophageal stricture with GERD   P:   Reviewed health and wellness pertinent to exam  Pap smear as above  Mammogram is due 03/2016  Will get a GI referral for the stricture GERD issues.  Will get labs and follow  Counseled on breast self exam, mammography screening, adequate intake of calcium and vitamin D, diet and exercise, Kegel's exercises return annually or prn  An After Visit Summary was printed and given to the patient.

## 2015-11-20 NOTE — Patient Instructions (Addendum)

## 2015-11-21 DIAGNOSIS — E032 Hypothyroidism due to medicaments and other exogenous substances: Secondary | ICD-10-CM | POA: Diagnosis not present

## 2015-11-21 DIAGNOSIS — E789 Disorder of lipoprotein metabolism, unspecified: Secondary | ICD-10-CM | POA: Diagnosis not present

## 2015-11-21 DIAGNOSIS — M542 Cervicalgia: Secondary | ICD-10-CM | POA: Diagnosis not present

## 2015-11-21 DIAGNOSIS — M50322 Other cervical disc degeneration at C5-C6 level: Secondary | ICD-10-CM | POA: Diagnosis not present

## 2015-11-21 LAB — HEPATITIS C ANTIBODY: HCV AB: NEGATIVE

## 2015-11-21 NOTE — Progress Notes (Signed)
Encounter reviewed by Dr. Aracelia Brinson Amundson C. Silva.  

## 2016-01-07 DIAGNOSIS — R51 Headache: Secondary | ICD-10-CM | POA: Diagnosis not present

## 2016-01-10 DIAGNOSIS — Z800 Family history of malignant neoplasm of digestive organs: Secondary | ICD-10-CM | POA: Diagnosis not present

## 2016-01-10 DIAGNOSIS — K219 Gastro-esophageal reflux disease without esophagitis: Secondary | ICD-10-CM | POA: Diagnosis not present

## 2016-01-10 DIAGNOSIS — Z1211 Encounter for screening for malignant neoplasm of colon: Secondary | ICD-10-CM | POA: Diagnosis not present

## 2016-01-10 DIAGNOSIS — K5900 Constipation, unspecified: Secondary | ICD-10-CM | POA: Diagnosis not present

## 2016-01-10 DIAGNOSIS — K625 Hemorrhage of anus and rectum: Secondary | ICD-10-CM | POA: Diagnosis not present

## 2016-03-19 DIAGNOSIS — L821 Other seborrheic keratosis: Secondary | ICD-10-CM | POA: Diagnosis not present

## 2016-03-25 DIAGNOSIS — H26493 Other secondary cataract, bilateral: Secondary | ICD-10-CM | POA: Diagnosis not present

## 2016-03-25 DIAGNOSIS — H43812 Vitreous degeneration, left eye: Secondary | ICD-10-CM | POA: Diagnosis not present

## 2016-03-25 DIAGNOSIS — Z961 Presence of intraocular lens: Secondary | ICD-10-CM | POA: Diagnosis not present

## 2016-04-04 ENCOUNTER — Ambulatory Visit (INDEPENDENT_AMBULATORY_CARE_PROVIDER_SITE_OTHER): Payer: Medicare Other | Admitting: Cardiovascular Disease

## 2016-04-04 ENCOUNTER — Encounter: Payer: Self-pay | Admitting: Cardiovascular Disease

## 2016-04-04 VITALS — BP 100/62 | HR 59 | Ht 61.0 in | Wt 131.6 lb

## 2016-04-04 DIAGNOSIS — E039 Hypothyroidism, unspecified: Secondary | ICD-10-CM

## 2016-04-04 DIAGNOSIS — Z8249 Family history of ischemic heart disease and other diseases of the circulatory system: Secondary | ICD-10-CM | POA: Diagnosis not present

## 2016-04-04 DIAGNOSIS — E785 Hyperlipidemia, unspecified: Secondary | ICD-10-CM

## 2016-04-04 DIAGNOSIS — R0789 Other chest pain: Secondary | ICD-10-CM | POA: Diagnosis not present

## 2016-04-04 DIAGNOSIS — M791 Myalgia, unspecified site: Secondary | ICD-10-CM

## 2016-04-04 NOTE — Patient Instructions (Signed)
Your physician recommends that you continue on your current medications as directed. Please refer to the Current Medication list given to you today.  Dr Claiborne Billings recommends that you schedule a follow-up appointment in 1 year. You will receive a reminder letter in the mail two months in advance. If you don't receive a letter, please call our office to schedule the follow-up appointment.  If you need a refill on your cardiac medications before your next appointment, please call your pharmacy.

## 2016-04-06 ENCOUNTER — Encounter: Payer: Self-pay | Admitting: Cardiovascular Disease

## 2016-04-06 DIAGNOSIS — M791 Myalgia, unspecified site: Secondary | ICD-10-CM | POA: Insufficient documentation

## 2016-04-06 NOTE — Progress Notes (Signed)
Patient ID: MARKETIA STALLSMITH, female   DOB: 04/27/1946, 70 y.o.   MRN: [id number]      Primary M.D.: Dr. Gareth Eagle  HPI: JAQUELYNE FIRKUS is a 70 y.o. female who presents to the office today for a cardiology followup evaluation.  I last saw her in September 2015.  Ms. Nabi has a history of hypothyroidism and also history of hyperlipidemia.   I had initially seen her over 5 years ago for atypical chest pain.  This seemed more musculoskeletal in etiology.  At that time, she reported having had a routine treadmill test over 5 years prior to that evaluation.  She was scheduled for subsequent exercise Myoview scan.  However, she never had this done.  I saw her 3-1/2 years later and she was remaining stable she has remained fairly stable and has noted rare episodes of sharp, left-sided chest discomfort. She denies any clearcut exertional precipitation.  She denies any exertional dyspnea.  Family history is notable in that her brother died suddenly at age 13 from presumed heart disease, although this was never fully evaluated.  Since I last saw her in September 2015, she denies any episodes of shortness of breath.  At times she notes a chest ache which is not exertionally precipitated.  At times she notes a sharp twinge of chest discomfort which lasts seconds.  She tells me she had blood work done by Dr. Wilson Singer in February 2017.  His sleep had been on Crestor and been taking this only 2-3 times per week and she was told to increase this to 4-5 times per week.  She admits to occasional left hip pain.  She admits to being active but does not routinely exercise.  She denies palpitations.  She denies presyncope or syncope.  She denies difficulty with sleep.  She presents for evaluation   Past Medical History  Diagnosis Date  . Gastritis   . Helicobacter pylori gastritis   . Esophageal stricture   . GERD (gastroesophageal reflux disease)   . IBS (irritable bowel syndrome)   . Internal hemorrhoids     . External hemorrhoids   . Hiatal hernia   . Hyperlipemia   . Thyroid disease   . Renal cyst   . Duodenal diverticulum   . Hypothyroid   . Diverticulosis 11/2003  . Hypothyroid 39    Past Surgical History  Procedure Laterality Date  . Nose surgery      Deviated Septum   . Laparoscopy  1981  . Dilation and curettage of uterus      x 2    Allergies  Allergen Reactions  . Codeine Nausea Only    Current Outpatient Prescriptions  Medication Sig Dispense Refill  . ANUCORT-HC 25 MG suppository INSERT 1 SUPPOSITORY RECTALLY AT BEDTIME 12 suppository 0  . cholecalciferol (VITAMIN D) 1000 UNITS tablet Take 2,000 Units by mouth daily.    . CRESTOR 20 MG tablet Take 10 mg by mouth daily. 2-3  Times per week    . hyoscyamine (LEVSIN SL) 0.125 MG SL tablet Take one tablet sl every 4-6 hours as needed for Abdominal pain and cramping 30 tablet 0  . levothyroxine (SYNTHROID, LEVOTHROID) 88 MCG tablet Take 88 mcg by mouth daily.      . Omega-3 Fatty Acids (OMEGA 3 PO) Take 1 capsule by mouth daily.      . Probiotic Product (PROBIOTIC DAILY PO) Take by mouth.     No current facility-administered medications for this visit.  Social History   Social History  . Marital Status: Married    Spouse Name: N/A  . Number of Children: N/A  . Years of Education: N/A   Occupational History  . Not on file.   Social History Main Topics  . Smoking status: Former Smoker -- 1.00 packs/day for 7 years    Quit date: 11/03/1966  . Smokeless tobacco: Never Used  . Alcohol Use: No  . Drug Use: No  . Sexual Activity: Not on file   Other Topics Concern  . Not on file   Social History Narrative   Social history is notable in that she completed 11th grade of education.  She has 2 children and 4 grandchildren.  There is no EtOH use.  Family History  Problem Relation Age of Onset  . Colon polyps Brother   . Colon cancer      grandmother???  . COPD    . Heart disease Brother   . Lung cancer  Father   . Osteoporosis Mother   . Osteoporosis Sister   . CVA Maternal Grandmother   . CVA Maternal Grandfather   . CVA Paternal Grandmother   . Heart attack Paternal Grandfather 94    ROS General: Negative; No fevers, chills, or night sweats HEENT: Negative; No changes in vision or hearing, sinus congestion, difficulty swallowing Pulmonary: Negative; No cough, wheezing, shortness of breath, hemoptysis Cardiovascular: See HPI:  GI: Positive for constipation No nausea, vomiting, diarrhea, or abdominal pain GU: Negative; No dysuria, hematuria, or difficulty voiding Musculoskeletal: Negative; no myalgias, joint pain, or weakness Hematologic: Negative; no easy bruising, bleeding Endocrine: Positive for hypothyroidism; no diabetes, history of vitamin D insufficiency Neuro: Negative; no changes in balance, headaches Skin: Negative; No rashes or skin lesions Psychiatric: Negative; No behavioral problems, depression Sleep: Negative; No snoring,  daytime sleepiness, hypersomnolence, bruxism, restless legs, hypnogognic hallucinations. Other comprehensive 14 point system review is negative   Physical Exam BP 100/62 mmHg  Pulse 59  Ht _0  (1.549 m)  Wt 131 lb 9.6 oz (59.693 kg)  BMI 24.88 kg/m2  LMP 11/03/2000 (Approximate)   Wt Readings from Last 3 Encounters:  04/04/16 131 lb 9.6 oz (59.693 kg)  11/20/15 130 lb (58.968 kg)  01/17/15 140 lb (63.504 kg)   General: Alert, oriented, no distress.  Skin: normal turgor, no rashes, warm and dry HEENT: Normocephalic, atraumatic. Pupils equal round and reactive to light; sclera anicteric; extraocular muscles intact, No lid lag; Nose without nasal septal hypertrophy; Mouth/Parynx benign; Mallinpatti scale 2 Neck: No JVD, no carotid bruits; normal carotid upstroke Lungs: clear to ausculatation and percussion bilaterally; no wheezing or rales, normal inspiratory and expiratory effort Chest wall: without tenderness to palpitation Heart: PMI  not displaced, RRR, s1 s2 normal, very faint 1/6 systolic murmur, No diastolic murmur, no rubs, gallops, thrills, or heaves Abdomen: soft, nontender; no hepatosplenomehaly, BS+; abdominal aorta nontender and not dilated by palpation. Back: no CVA tenderness Pulses: 2+  Musculoskeletal: full range of motion, normal strength, no joint deformities Extremities: Pulses 2+, no clubbing cyanosis or edema, Homan's sign negative  Neurologic: grossly nonfocal; Cranial nerves grossly wnl Psychologic: Normal mood and affect  ECG (independently read by me): Sinus bradycardia 59 bpm; normal intervals  September 2015 ECG (independently read by me): Normal sinus rhythm at 78 beats per minute.  No ectopy.  QTc interval slightly increased at 460 ms.  LABS:  BMP Latest Ref Rng 01/17/2015 08/07/2013 10/08/2012  Glucose 70 - 99 mg/dL 107(H) 88 127(H)  BUN 6 - 23 mg/dL _0 Creatinine 0.40 - 1.20 mg/dL 0.84 0.90 0.8  Sodium 135 - 145 mEq/L 137 142 140  Potassium 3.5 - 5.1 mEq/L 3.7 3.8 3.7  Chloride 96 - 112 mEq/L 104 108 106  CO2 19 - 32 mEq/L _1 Calcium 8.4 - 10.5 mg/dL 9.0 9.7 9.0    Hepatic Function Latest Ref Rng 08/07/2013 10/08/2012  Total Protein 6.0 - 8.3 g/dL 6.6 6.6  Albumin 3.5 - 5.2 g/dL 4.2 3.9  AST 0 - 37 U/L 24 23  ALT 0 - 35 U/L 21 22  Alk Phosphatase 39 - 117 U/L 84 72  Total Bilirubin 0.3 - 1.2 mg/dL 0.6 0.8   CBC Latest Ref Rng 08/07/2013 10/08/2012  WBC 4.0 - 10.5 K/uL 5.1 5.7  Hemoglobin 12.0 - 15.0 g/dL 14.9 14.0  Hematocrit 36.0 - 46.0 % 44.3 42.5  Platelets 150 - 400 K/uL 216 241.0   Lab Results  Component Value Date   MCV 89.7 08/07/2013   MCV 90.2 10/08/2012   No results found for: TSH   Lipid Panel  No results found for: CHOL, TRIG, HDL, CHOLHDL, VLDL, LDLCALC, LDLDIRECT   RADIOLOGY: No results found.    ASSESSMENT AND PLAN: Ms. Kennadee Walthour is a 70 year old female with a 20 year history of hypothyroidism, as well as a greater than 12 year  history of hyperlipidemia.  She has noticed occasional episodes of somewhat atypical chest pain.  Family history is notable that her brother died at age 21 suddenly and this was presumed of cardiac etiology although not definitively evaluated. She had previously undergone a routine treadmill test which was negative for scheme.  He appeared when I initially saw her 5 years ago.  I suggested a nuclear perfusion study.  This has never been done.  However, socially she has remained stable.  She denies any exertionally precipitated chest pain.  She continues to experience musculoskeletal-like chest discomfort intermittently.  Her ECG remained stable.  I discussed the possibility of repeating a treadmill test sometime in the future for surveillance and I do not think her symptoms warrant at this be done expeditiously.  She has hyperlipidemia.  I have suggested a target LDL of less than 70.  Particular with her family history.  Dr. Wilson Singer had recently increased her Crestor.  I suggested adding coenzyme Q10 to see if this may help with some intermittent muscle aches.  Her blood pressure today is stable and on repeat by me was 114/70.  Her body mass index is stable.  She has lost 10 pounds over the past year.  I have recommended follow-up blood work to be done by Dr. Wilson Singer in light of her recent increased statin dose.  I will review these when available.  As long as she remains stable, I will see her in one year for reevaluation.  Time spent: 25 minutes  Troy Sine, MD, [facility]  04/06/2016 3:06 PM

## 2016-05-02 ENCOUNTER — Telehealth: Payer: Self-pay | Admitting: Nurse Practitioner

## 2016-05-02 NOTE — Telephone Encounter (Signed)
Per patient leg aching extends from her hip to her toes. Is not localized. Denies any changes to the leg such as warmth, cold, or redness. Denies SOB. Reports aching only occurs when she lies down and is relieved by movement. Aching has been occuring intermittently for months per patient.

## 2016-05-02 NOTE — Telephone Encounter (Signed)
Patient has been having right leg pain at bedtime. She thinks it may be from a possible cyst on her ovary.

## 2016-05-02 NOTE — Telephone Encounter (Signed)
Spoke with patient. Patient states that she has been experiencing right leg "aching" when she lays down at night. Aching extends from her hip to her toes. Denies any sharp pains. States the aching only occurs when she lies down. Aching goes away with movement. Denies any pelvic, abdominal, or back pain. Patient is concerned this may be related to her ovary as a friend advised her it could be related. Denies any change in appetite, weight, nausea, or vomiting. States she has been increasingly bloated. Encouraged patient to see her PCP as I do not feel this is related to a gynelogical problem. Patient is concerned that if she sees her primary care doctor he will do a pelvic exam. She is requesting to see Kem Boroughs, FNP first and will see  PCP after if needed. Offered appointment for next week, but patient declines. Appointment scheduled for 05/13/2016 at 4 pm with Kem Boroughs, FNP. She is agreeable.  Routing to provider for final review. Patient agreeable to disposition. Will close encounter.

## 2016-05-02 NOTE — Telephone Encounter (Signed)
Was there any concern about DVT as to where her leg hurt?

## 2016-05-12 ENCOUNTER — Other Ambulatory Visit: Payer: Self-pay | Admitting: Emergency Medicine

## 2016-05-12 ENCOUNTER — Telehealth: Payer: Self-pay | Admitting: Nurse Practitioner

## 2016-05-12 DIAGNOSIS — Z1231 Encounter for screening mammogram for malignant neoplasm of breast: Secondary | ICD-10-CM

## 2016-05-12 NOTE — Telephone Encounter (Signed)
Patient called and reschedule her 05/13/16 appointment for "bloating, intermittent leg discomfort, worried about ovary" to 05/20/16. FYI only.

## 2016-05-13 ENCOUNTER — Ambulatory Visit: Payer: Self-pay | Admitting: Nurse Practitioner

## 2016-05-14 ENCOUNTER — Ambulatory Visit: Payer: Self-pay

## 2016-05-20 ENCOUNTER — Ambulatory Visit
Admission: RE | Admit: 2016-05-20 | Discharge: 2016-05-20 | Disposition: A | Discharge status: Home / Self Care | Payer: Medicare Other | Source: Ambulatory Visit | Attending: Emergency Medicine | Admitting: Emergency Medicine

## 2016-05-20 ENCOUNTER — Encounter: Payer: Self-pay | Admitting: Nurse Practitioner

## 2016-05-20 ENCOUNTER — Ambulatory Visit (INDEPENDENT_AMBULATORY_CARE_PROVIDER_SITE_OTHER): Payer: Medicare Other | Admitting: Nurse Practitioner

## 2016-05-20 VITALS — BP 90/62 | HR 68 | Ht 61.75 in | Wt 132.0 lb

## 2016-05-20 DIAGNOSIS — R1031 Right lower quadrant pain: Secondary | ICD-10-CM | POA: Diagnosis not present

## 2016-05-20 DIAGNOSIS — Z1231 Encounter for screening mammogram for malignant neoplasm of breast: Secondary | ICD-10-CM

## 2016-05-20 LAB — POCT URINALYSIS DIPSTICK
Bilirubin, UA: NEGATIVE
Glucose, UA: NEGATIVE
Ketones, UA: NEGATIVE
NITRITE UA: NEGATIVE
PROTEIN UA: NEGATIVE
RBC UA: NEGATIVE
UROBILINOGEN UA: NEGATIVE
pH, UA: 6

## 2016-05-20 NOTE — Progress Notes (Signed)
Subjective:     Patient ID: Victoria Peters, female   DOB: [birth year], 70 y.o.   MRN: [id number]  HPI  This 70 yo W female presents with history of right leg pain with fairly sudden onset approximately 3 weeks ago.  She first noted the pain awakening her from sleep at 2 am.  She would change positions and lay on her back or left side without relief of symptoms.  Pain scale 7/10 .  states pain was in right leg initially around the knee and now going up and down the leg.   The longer the pain timing it goes to the groin and lower abdomen.  Occasionally will go through to the buttocks.  Describes the pain as:  Aching sharp and intense pain in the right hip and thigh.  To get relief she has to get out of bed and walk.  When first getting up there is a limp of right leg but walks around for about 5-10 minutes before going back to bed.  Most of time can go back to sleep.  At times the longer she stands increases the pain.  Cans take 4 Advil before bed and gets sleep until 5 am when the same thing happens.   Patient denies trauma, fall, lifting pulling, yard work, moving heavy objects, etc.  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  She denies any pain in the back.  No associated GI problems with bloating, changes in BM, changes in urination.  She had been having GERD symptoms when here for AEX but that improved with change of diet.  She denies any swelling or pain in the calf area.  No change of medications or new treatments of any type.Marland Kitchen Another symptoms for some time (>6 mos ?) is a red bloody stain on pad.  She thinks this is urinary or vaginal because of where it is on the pad.  She has had no rectal problems or flare of hemorrhoids.   This has only occurred a few times.    Review of Systems  Constitutional: Negative for fever, chills, diaphoresis, appetite change, fatigue and unexpected weight change.  Respiratory: Negative.   Cardiovascular: Negative.    Gastrointestinal: Positive for abdominal pain. Negative for nausea, vomiting, diarrhea, constipation, blood in stool, abdominal distention, anal bleeding and rectal pain.  Endocrine: Negative for cold intolerance, heat intolerance, polydipsia, polyphagia and polyuria.  Genitourinary: Positive for vaginal bleeding and pelvic pain. Negative for dysuria, urgency, frequency, flank pain, decreased urine volume, vaginal discharge, difficulty urinating, genital sores, vaginal pain, menstrual problem and dyspareunia.       ? Vaginal bleeding  Musculoskeletal: Positive for myalgias, arthralgias and gait problem. Negative for back pain, joint swelling, neck pain and neck stiffness.  Skin: Negative.   Neurological: Negative for dizziness, tremors, seizures, syncope, weakness, light-headedness, numbness and headaches.  Psychiatric/Behavioral: Positive for sleep disturbance. Negative for hallucinations, behavioral problems, dysphoric mood, decreased concentration and agitation. The patient is nervous/anxious. The patient is not hyperactive.        Objective:   Physical Exam  Constitutional: She is oriented to person, place, and time. She appears well-developed and well-nourished. No distress.  Cardiovascular: Normal rate.   Pulmonary/Chest: Effort normal.  Abdominal: Soft. Bowel sounds are normal. She exhibits no distension and no mass. There is no tenderness. There is no rebound and no guarding.  Genitourinary:  The vaginal area is atrophic.  No bleeding noted. Cotton swab all around  the vagina that was clear. Pt request pap since did not get one in January.  This was done. On bimanual no mass but she did have discomfort to palpation on the right with none on the left.  Could not reproduce the same pain.  Musculoskeletal: Normal range of motion. She exhibits no edema or tenderness.  Straight leg raise bilaterally without pain.  Good and equal ROM of both hips without a hip click.  Both legs, feet, ankles,  calves without edema, pain, tenderness, or erythema.  She has good pedal and popliteal pulses.  No inguinal nodes. No signs of DVT. Homans sign is negative.  SI joint spaces without pain to palpation.  Good ambulation without limping.  No bursitis of right hip.  Neurological: She is alert and oriented to person, place, and time.  Psychiatric: She has a normal mood and affect. Her behavior is normal. Judgment and thought content normal.   Urine:  Trace leuk's, no RBC    Assessment:     Right hip pain - suspected arthritis in hip but exam was negative to reproduce any symptoms Pain radiation to right inguinal area and lower abdomen ? Etiology - would like to R/O GYN origin with PUS to assess the ovaries and uterus ? PMB as cause of bright red blood on pad - pap is done per request R/O UTI    Plan:     Will follow with urine culture Will schedule PUS and follow -schedule for Thursday is full will get next Tuesday. Monitor any bleeding She may take Advil prn pain since I still believe this to be MS origin She is to [facility] with any symptoms or concerns

## 2016-05-21 ENCOUNTER — Telehealth: Payer: Self-pay | Admitting: Nurse Practitioner

## 2016-05-21 NOTE — Telephone Encounter (Signed)
Called patient to review benefits for a recommended procedure. Left Voicemail requesting a call back. °

## 2016-05-21 NOTE — Telephone Encounter (Signed)
Patient returning call.

## 2016-05-22 LAB — IPS PAP SMEAR ONLY

## 2016-05-22 LAB — URINE CULTURE: ORGANISM ID, BACTERIA: NO GROWTH

## 2016-05-22 NOTE — Telephone Encounter (Deleted)
Opened in error

## 2016-05-22 NOTE — Progress Notes (Signed)
Reviewed personally.  M. Suzanne Areeba Sulser, MD.  

## 2016-05-22 NOTE — Telephone Encounter (Signed)
Spoke with pt regarding benefit for ultrasound. Patient understood and agreeable. Patient scheduled 05/27/16 with Dr Talbert Nan. Patient is aware of arrival date and time. Patient has no furtherestions.   Routing to Kem Boroughs for final review

## 2016-05-27 ENCOUNTER — Other Ambulatory Visit: Payer: Self-pay

## 2016-05-27 ENCOUNTER — Other Ambulatory Visit: Payer: Self-pay | Admitting: Obstetrics and Gynecology

## 2016-06-02 ENCOUNTER — Telehealth: Payer: Self-pay | Admitting: Nurse Practitioner

## 2016-06-02 NOTE — Telephone Encounter (Signed)
OK for PUS as scheduled.

## 2016-06-02 NOTE — Telephone Encounter (Signed)
Patient called to reschedule ultrasound for 06/03/16. Patient states she was looking at the wrong month when she made the appointment and advises she is unable to make it 06/03/16. Patient has rescheduled appointment for 06/12/16 with Dr Sabra Heck  Routing to Kem Boroughs for review

## 2016-06-03 ENCOUNTER — Other Ambulatory Visit: Payer: Self-pay | Admitting: Obstetrics and Gynecology

## 2016-06-03 ENCOUNTER — Other Ambulatory Visit: Payer: Self-pay

## 2016-06-03 NOTE — Telephone Encounter (Signed)
Ok to close

## 2016-06-12 ENCOUNTER — Ambulatory Visit (INDEPENDENT_AMBULATORY_CARE_PROVIDER_SITE_OTHER): Payer: Medicare Other | Admitting: Obstetrics & Gynecology

## 2016-06-12 ENCOUNTER — Ambulatory Visit (INDEPENDENT_AMBULATORY_CARE_PROVIDER_SITE_OTHER): Payer: Medicare Other

## 2016-06-12 ENCOUNTER — Other Ambulatory Visit: Payer: Self-pay

## 2016-06-12 VITALS — BP 100/56 | HR 78 | Resp 14 | Ht 61.75 in | Wt 132.0 lb

## 2016-06-12 DIAGNOSIS — M79604 Pain in right leg: Secondary | ICD-10-CM

## 2016-06-12 DIAGNOSIS — R1031 Right lower quadrant pain: Secondary | ICD-10-CM | POA: Diagnosis not present

## 2016-06-12 DIAGNOSIS — M25551 Pain in right hip: Secondary | ICD-10-CM | POA: Diagnosis not present

## 2016-06-12 NOTE — Progress Notes (Signed)
70 y.o. G64P2012 Married Caucasian female here for pelvic ultrasound due to right leg pain that NP, Edman Circle, wanted to make sure was not of ovarian origin.  Pt is not have any vaginal bleeding.  Pain has been going on for about a month.  It usually wakes her from her sleep and is in the thigh but extends down further.  It is a aching and sharp pain.  She has to get out of bed and walk around for it to get better.  Mostly, she can go back to sleep after she walks around.  It does not feel like a muscle pain to her.  It is a deeper pain/ache.  Pt reports after a day of more activity, walking, or standing, she will definitely experience the pain.  She's tried taking advil before going to be on those nights with some improvement in the pain.  Pt states that Patty did some maneuvers in her hip and she was able to reproduce the pain so now she wonders if it is originating in her hip.    Patient's last menstrual period was 11/03/2000 (approximate).  Findings:  UTERUS: 4.0 x 3.0 x 1.9cm EMS: 2.38mm ADNEXA: Left ovary: 0.6 x 0.4 x 0.3cm       Right ovary: 1.3 x 0.7 x 0.5cm CUL DE SAC: no free fluid  Discussion:  Reviewed with pt that exam is normal and ovaries appear normal.  Due to overlying bowel gas, identification of ovaries was more difficult but no clear masses or cysts were note.  As well, pt seems to be more in hip and then in her leg so this would be an unusual presentation for ovarian disease unless she had something significant going on.    D/W pt additional evaluation that I feel would be best done through ortho.  She does not have anyone she's seen.  Would welcome a referral to try and "get to the bottom of this".  Assessment:  R hip pain, R leg pain Normal postmenopausal female gyn ultrasound today.  Plan:  Referral to ortho will be made  ~15 minutes spent with patient >50% of time was in face to face discussion of above.

## 2016-06-17 ENCOUNTER — Encounter: Payer: Self-pay | Admitting: Obstetrics & Gynecology

## 2016-08-15 DIAGNOSIS — Z23 Encounter for immunization: Secondary | ICD-10-CM | POA: Diagnosis not present

## 2016-11-12 DIAGNOSIS — E032 Hypothyroidism due to medicaments and other exogenous substances: Secondary | ICD-10-CM | POA: Diagnosis not present

## 2016-11-12 DIAGNOSIS — R300 Dysuria: Secondary | ICD-10-CM | POA: Diagnosis not present

## 2016-11-12 DIAGNOSIS — M549 Dorsalgia, unspecified: Secondary | ICD-10-CM | POA: Diagnosis not present

## 2016-11-12 DIAGNOSIS — N390 Urinary tract infection, site not specified: Secondary | ICD-10-CM | POA: Diagnosis not present

## 2016-11-12 DIAGNOSIS — M5136 Other intervertebral disc degeneration, lumbar region: Secondary | ICD-10-CM | POA: Diagnosis not present

## 2016-11-12 DIAGNOSIS — M1611 Unilateral primary osteoarthritis, right hip: Secondary | ICD-10-CM | POA: Diagnosis not present

## 2016-11-12 DIAGNOSIS — E789 Disorder of lipoprotein metabolism, unspecified: Secondary | ICD-10-CM | POA: Diagnosis not present

## 2016-11-13 ENCOUNTER — Other Ambulatory Visit: Payer: Self-pay | Admitting: Endocrinology

## 2016-11-13 ENCOUNTER — Ambulatory Visit
Admission: RE | Admit: 2016-11-13 | Discharge: 2016-11-13 | Disposition: A | Discharge status: Home / Self Care | Payer: Medicare Other | Source: Ambulatory Visit | Attending: Endocrinology | Admitting: Endocrinology

## 2016-11-13 DIAGNOSIS — M810 Age-related osteoporosis without current pathological fracture: Secondary | ICD-10-CM | POA: Diagnosis not present

## 2016-11-13 DIAGNOSIS — Z0000 Encounter for general adult medical examination without abnormal findings: Secondary | ICD-10-CM | POA: Diagnosis not present

## 2016-11-13 DIAGNOSIS — M5126 Other intervertebral disc displacement, lumbar region: Secondary | ICD-10-CM | POA: Diagnosis not present

## 2016-11-13 DIAGNOSIS — Z79899 Other long term (current) drug therapy: Secondary | ICD-10-CM | POA: Diagnosis not present

## 2016-11-13 DIAGNOSIS — E559 Vitamin D deficiency, unspecified: Secondary | ICD-10-CM | POA: Diagnosis not present

## 2016-11-13 DIAGNOSIS — M79604 Pain in right leg: Secondary | ICD-10-CM

## 2016-11-13 DIAGNOSIS — E789 Disorder of lipoprotein metabolism, unspecified: Secondary | ICD-10-CM | POA: Diagnosis not present

## 2016-11-21 ENCOUNTER — Ambulatory Visit: Payer: Self-pay | Admitting: Nurse Practitioner

## 2016-11-26 DIAGNOSIS — D125 Benign neoplasm of sigmoid colon: Secondary | ICD-10-CM | POA: Diagnosis not present

## 2016-11-26 DIAGNOSIS — K219 Gastro-esophageal reflux disease without esophagitis: Secondary | ICD-10-CM | POA: Diagnosis not present

## 2016-11-26 DIAGNOSIS — K210 Gastro-esophageal reflux disease with esophagitis: Secondary | ICD-10-CM | POA: Diagnosis not present

## 2016-11-26 DIAGNOSIS — Z1211 Encounter for screening for malignant neoplasm of colon: Secondary | ICD-10-CM | POA: Diagnosis not present

## 2016-11-26 DIAGNOSIS — K2970 Gastritis, unspecified, without bleeding: Secondary | ICD-10-CM | POA: Diagnosis not present

## 2016-11-26 DIAGNOSIS — K209 Esophagitis, unspecified: Secondary | ICD-10-CM | POA: Diagnosis not present

## 2016-11-26 DIAGNOSIS — K449 Diaphragmatic hernia without obstruction or gangrene: Secondary | ICD-10-CM | POA: Diagnosis not present

## 2016-11-26 DIAGNOSIS — K635 Polyp of colon: Secondary | ICD-10-CM | POA: Diagnosis not present

## 2016-11-26 LAB — HM COLONOSCOPY

## 2016-11-27 DIAGNOSIS — R109 Unspecified abdominal pain: Secondary | ICD-10-CM | POA: Diagnosis not present

## 2016-11-27 DIAGNOSIS — E032 Hypothyroidism due to medicaments and other exogenous substances: Secondary | ICD-10-CM | POA: Diagnosis not present

## 2016-11-27 DIAGNOSIS — E789 Disorder of lipoprotein metabolism, unspecified: Secondary | ICD-10-CM | POA: Diagnosis not present

## 2016-11-27 DIAGNOSIS — M545 Low back pain: Secondary | ICD-10-CM | POA: Diagnosis not present

## 2016-12-10 ENCOUNTER — Encounter: Payer: Self-pay | Admitting: Nurse Practitioner

## 2016-12-30 DIAGNOSIS — J069 Acute upper respiratory infection, unspecified: Secondary | ICD-10-CM | POA: Diagnosis not present

## 2017-01-02 ENCOUNTER — Ambulatory Visit: Payer: Self-pay | Admitting: Nurse Practitioner

## 2017-04-03 DIAGNOSIS — E039 Hypothyroidism, unspecified: Secondary | ICD-10-CM | POA: Diagnosis not present

## 2017-04-20 DIAGNOSIS — E039 Hypothyroidism, unspecified: Secondary | ICD-10-CM | POA: Diagnosis not present

## 2017-04-20 DIAGNOSIS — M542 Cervicalgia: Secondary | ICD-10-CM | POA: Diagnosis not present

## 2017-04-20 DIAGNOSIS — W57XXXA Bitten or stung by nonvenomous insect and other nonvenomous arthropods, initial encounter: Secondary | ICD-10-CM | POA: Diagnosis not present

## 2017-05-19 DIAGNOSIS — H26493 Other secondary cataract, bilateral: Secondary | ICD-10-CM | POA: Diagnosis not present

## 2017-05-19 DIAGNOSIS — H43812 Vitreous degeneration, left eye: Secondary | ICD-10-CM | POA: Diagnosis not present

## 2017-05-19 DIAGNOSIS — Z961 Presence of intraocular lens: Secondary | ICD-10-CM | POA: Diagnosis not present

## 2017-05-21 ENCOUNTER — Other Ambulatory Visit: Payer: Self-pay | Admitting: Internal Medicine

## 2017-05-21 DIAGNOSIS — E7800 Pure hypercholesterolemia, unspecified: Secondary | ICD-10-CM | POA: Diagnosis not present

## 2017-05-21 DIAGNOSIS — M542 Cervicalgia: Secondary | ICD-10-CM | POA: Diagnosis not present

## 2017-05-21 DIAGNOSIS — M5412 Radiculopathy, cervical region: Secondary | ICD-10-CM

## 2017-05-21 DIAGNOSIS — E039 Hypothyroidism, unspecified: Secondary | ICD-10-CM | POA: Diagnosis not present

## 2017-05-26 ENCOUNTER — Other Ambulatory Visit: Payer: Self-pay | Admitting: Internal Medicine

## 2017-05-26 DIAGNOSIS — R519 Headache, unspecified: Secondary | ICD-10-CM

## 2017-05-26 DIAGNOSIS — R51 Headache: Principal | ICD-10-CM

## 2017-06-05 ENCOUNTER — Other Ambulatory Visit: Payer: Medicare Other

## 2017-06-06 ENCOUNTER — Ambulatory Visit
Admission: RE | Admit: 2017-06-06 | Discharge: 2017-06-06 | Disposition: A | Discharge status: Home / Self Care | Payer: Medicare Other | Source: Ambulatory Visit | Attending: Internal Medicine | Admitting: Internal Medicine

## 2017-06-06 DIAGNOSIS — M5412 Radiculopathy, cervical region: Secondary | ICD-10-CM

## 2017-06-06 DIAGNOSIS — M50121 Cervical disc disorder at C4-C5 level with radiculopathy: Secondary | ICD-10-CM | POA: Diagnosis not present

## 2017-06-06 DIAGNOSIS — R51 Headache: Secondary | ICD-10-CM | POA: Diagnosis not present

## 2017-06-06 DIAGNOSIS — R519 Headache, unspecified: Secondary | ICD-10-CM

## 2017-07-02 DIAGNOSIS — H26492 Other secondary cataract, left eye: Secondary | ICD-10-CM | POA: Diagnosis not present

## 2017-07-02 DIAGNOSIS — H43812 Vitreous degeneration, left eye: Secondary | ICD-10-CM | POA: Diagnosis not present

## 2017-07-02 DIAGNOSIS — Z961 Presence of intraocular lens: Secondary | ICD-10-CM | POA: Diagnosis not present

## 2017-07-13 NOTE — Progress Notes (Signed)
71 y.o. [id number] MarriedCaucasianF here for annual exam.  Doing well.  No vaginal bleeding.    PCP:  Dr. Maudie Mercury.  Has done blood work this year.  Has follow up appt in October.  Patient's last menstrual period was 11/03/2000 (approximate).          Sexually active: No.  The current method of family planning is post menopausal status.    Exercising: No.  The patient does not participate in regular exercise at present. Smoker:  Former smoker   Health Maintenance: Pap: 05/21/16 negative, 06/26/10 negative   History of abnormal Pap:  no MMG:  05/20/16 BIRADS 1 negative  Colonoscopy:  11/26/16, hyperplastic polyps, Dr. Collene Mares  BMD:   2013 at Dr. Wilson Singer  TDaP:  08/05/11  Pneumonia vaccine(s):  01/23/11, 09/14/13  Zostavax:   11/23/07  Hep C testing: 11/20/15 negative  Screening Labs: PCP, Hb today: PCP   reports that she quit smoking about 50 years ago. She has a 7.00 pack-year smoking history. She has never used smokeless tobacco. She reports that she does not drink alcohol or use drugs.  Past Medical History:  Diagnosis Date  . Diverticulosis 11/2003  . Duodenal diverticulum   . Esophageal stricture   . External hemorrhoids   . Gastritis   . GERD (gastroesophageal reflux disease)   . Helicobacter pylori gastritis   . Hiatal hernia   . Hyperlipemia   . Hypothyroid   . Hypothyroid 71  . IBS (irritable bowel syndrome)   . Internal hemorrhoids   . Renal cyst   . Thyroid disease     Past Surgical History:  Procedure Laterality Date  . DILATION AND CURETTAGE OF UTERUS     x 2  . LAPAROSCOPY  1981  . NOSE SURGERY     Deviated Septum     Current Outpatient Prescriptions  Medication Sig Dispense Refill  . levothyroxine (SYNTHROID, LEVOTHROID) 75 MCG tablet     . Omega-3 Fatty Acids (OMEGA 3 PO) Take 1 capsule by mouth daily.      . pantoprazole (PROTONIX) 40 MG tablet TK 1 T PO  ONCE D  6  . Probiotic Product (PROBIOTIC DAILY PO) Take by mouth.    . rosuvastatin (CRESTOR) 10 MG tablet  Take by mouth 2 (two) times a week.   0  . hyoscyamine (LEVSIN SL) 0.125 MG SL tablet Take one tablet sl every 4-6 hours as needed for Abdominal pain and cramping (Patient not taking: Reported on 07/14/2017) 30 tablet 0   No current facility-administered medications for this visit.     Family History  Problem Relation Age of Onset  . Heart disease Brother   . Lung cancer Father   . Osteoporosis Mother   . Colon polyps Brother   . Colon cancer Unknown        grandmother???  . COPD Unknown   . Osteoporosis Sister   . CVA Maternal Grandmother   . CVA Maternal Grandfather   . CVA Paternal Grandmother   . Heart attack Paternal Grandfather 47    ROS:  Pertinent items are noted in HPI.  Otherwise, a comprehensive ROS was negative.  Exam:   BP (!) 100/56 (BP Location: Right Arm, Patient Position: Sitting, Cuff Size: Normal)   Pulse 76   Resp 14   Ht 5\' 1"  (1.549 m)   Wt 129 lb (58.5 kg)   LMP 11/03/2000 (Approximate)   BMI 24.37 kg/m   Weight change: @WEIGHTCHANGE @ Height:   Height: 5\' 1"  (154.9  cm)  Ht Readings from Last 3 Encounters:  07/14/17 5\' 1"  (1.549 m)  06/12/16 5' 1.75" (1.568 m)  05/20/16 5' 1.75" (1.568 m)    General appearance: alert, cooperative and appears stated age Head: Normocephalic, without obvious abnormality, atraumatic Neck: no adenopathy, supple, symmetrical, trachea midline and thyroid normal to inspection and palpation Lungs: clear to auscultation bilaterally Breasts: normal appearance, no masses or tenderness Heart: regular rate and rhythm Abdomen: soft, non-tender; bowel sounds normal; no masses,  no organomegaly Extremities: extremities normal, atraumatic, no cyanosis or edema Skin: Skin color, texture, turgor normal. No rashes or lesions Lymph nodes: Cervical, supraclavicular, and axillary nodes normal. No abnormal inguinal nodes palpated Neurologic: Grossly normal   Pelvic: External genitalia:  no lesions              Urethra:  normal  appearing urethra with no masses, tenderness or lesions              Bartholins and Skenes: normal                 Vagina: normal appearing vagina with normal color and discharge, no lesions              Cervix: no lesions              Pap taken: No. Bimanual Exam:  Uterus:  normal size, contour, position, consistency, mobility, non-tender              Adnexa: normal adnexa and no mass, fullness, tenderness               Rectovaginal: Confirms               Anus:  normal sphincter tone, no lesions  Chaperone was present for exam.  A:  Well Woman with normal exam PMP, no HRT Mixed urinary incontinence Hypothyroidism IH/O diverticulosis, esophageal stricture with GERD Family hx of CVD  P:   Mammogram guideliens reviewed.  Pt aware due. pap smear not needed.  Done 2017 and was negative. Levsin rx provided.  Prn use.  #30/1RF. Lab work UTD with Dr. Maudie Mercury Rx for shingrix vaccine given. return annually or prn

## 2017-07-14 ENCOUNTER — Encounter: Payer: Self-pay | Admitting: Obstetrics & Gynecology

## 2017-07-14 ENCOUNTER — Ambulatory Visit (INDEPENDENT_AMBULATORY_CARE_PROVIDER_SITE_OTHER): Payer: Medicare Other | Admitting: Obstetrics & Gynecology

## 2017-07-14 VITALS — BP 100/56 | HR 76 | Resp 14 | Ht 61.0 in | Wt 129.0 lb

## 2017-07-14 DIAGNOSIS — Z01419 Encounter for gynecological examination (general) (routine) without abnormal findings: Secondary | ICD-10-CM

## 2017-07-14 MED ORDER — HYOSCYAMINE SULFATE 0.125 MG SL SUBL: SUBLINGUAL_TABLET | SUBLINGUAL | 1 refills | Status: DC

## 2017-07-14 NOTE — Patient Instructions (Signed)
[facility]  [address] [place] [place],  [place], [place] [address] [phone]

## 2017-07-15 ENCOUNTER — Other Ambulatory Visit: Payer: Self-pay | Admitting: Obstetrics & Gynecology

## 2017-07-15 DIAGNOSIS — Z1231 Encounter for screening mammogram for malignant neoplasm of breast: Secondary | ICD-10-CM

## 2017-07-23 DIAGNOSIS — H26491 Other secondary cataract, right eye: Secondary | ICD-10-CM | POA: Diagnosis not present

## 2017-07-27 ENCOUNTER — Ambulatory Visit
Admission: RE | Admit: 2017-07-27 | Discharge: 2017-07-27 | Disposition: A | Discharge status: Home / Self Care | Payer: Medicare Other | Source: Ambulatory Visit | Attending: Obstetrics & Gynecology | Admitting: Obstetrics & Gynecology

## 2017-07-27 DIAGNOSIS — Z1231 Encounter for screening mammogram for malignant neoplasm of breast: Secondary | ICD-10-CM | POA: Diagnosis not present

## 2017-10-25 ENCOUNTER — Other Ambulatory Visit: Payer: Self-pay

## 2017-10-25 ENCOUNTER — Observation Stay (HOSPITAL_BASED_OUTPATIENT_CLINIC_OR_DEPARTMENT_OTHER)
Admission: EM | Admit: 2017-10-25 | Discharge: 2017-10-26 | Disposition: A | Discharge status: Home / Self Care | Payer: Medicare Other | Attending: Internal Medicine | Admitting: Internal Medicine

## 2017-10-25 ENCOUNTER — Emergency Department (HOSPITAL_BASED_OUTPATIENT_CLINIC_OR_DEPARTMENT_OTHER): Payer: Medicare Other

## 2017-10-25 ENCOUNTER — Encounter (HOSPITAL_BASED_OUTPATIENT_CLINIC_OR_DEPARTMENT_OTHER): Payer: Self-pay

## 2017-10-25 DIAGNOSIS — E039 Hypothyroidism, unspecified: Secondary | ICD-10-CM | POA: Diagnosis not present

## 2017-10-25 DIAGNOSIS — Z885 Allergy status to narcotic agent status: Secondary | ICD-10-CM | POA: Diagnosis not present

## 2017-10-25 DIAGNOSIS — E785 Hyperlipidemia, unspecified: Secondary | ICD-10-CM | POA: Insufficient documentation

## 2017-10-25 DIAGNOSIS — K589 Irritable bowel syndrome without diarrhea: Secondary | ICD-10-CM | POA: Insufficient documentation

## 2017-10-25 DIAGNOSIS — A419 Sepsis, unspecified organism: Secondary | ICD-10-CM | POA: Diagnosis not present

## 2017-10-25 DIAGNOSIS — K219 Gastro-esophageal reflux disease without esophagitis: Secondary | ICD-10-CM | POA: Diagnosis not present

## 2017-10-25 DIAGNOSIS — K449 Diaphragmatic hernia without obstruction or gangrene: Secondary | ICD-10-CM | POA: Diagnosis not present

## 2017-10-25 DIAGNOSIS — E7800 Pure hypercholesterolemia, unspecified: Secondary | ICD-10-CM | POA: Insufficient documentation

## 2017-10-25 DIAGNOSIS — Z801 Family history of malignant neoplasm of trachea, bronchus and lung: Secondary | ICD-10-CM | POA: Insufficient documentation

## 2017-10-25 DIAGNOSIS — Z87891 Personal history of nicotine dependence: Secondary | ICD-10-CM | POA: Diagnosis not present

## 2017-10-25 DIAGNOSIS — Z800 Family history of malignant neoplasm of digestive organs: Secondary | ICD-10-CM | POA: Insufficient documentation

## 2017-10-25 DIAGNOSIS — Z8249 Family history of ischemic heart disease and other diseases of the circulatory system: Secondary | ICD-10-CM | POA: Diagnosis not present

## 2017-10-25 DIAGNOSIS — R0602 Shortness of breath: Secondary | ICD-10-CM | POA: Diagnosis not present

## 2017-10-25 DIAGNOSIS — Z79899 Other long term (current) drug therapy: Secondary | ICD-10-CM | POA: Diagnosis not present

## 2017-10-25 DIAGNOSIS — R079 Chest pain, unspecified: Secondary | ICD-10-CM | POA: Diagnosis not present

## 2017-10-25 DIAGNOSIS — Z9889 Other specified postprocedural states: Secondary | ICD-10-CM | POA: Diagnosis not present

## 2017-10-25 DIAGNOSIS — R0789 Other chest pain: Secondary | ICD-10-CM | POA: Diagnosis not present

## 2017-10-25 DIAGNOSIS — Z8262 Family history of osteoporosis: Secondary | ICD-10-CM | POA: Diagnosis not present

## 2017-10-25 DIAGNOSIS — R6510 Systemic inflammatory response syndrome (SIRS) of non-infectious origin without acute organ dysfunction: Secondary | ICD-10-CM | POA: Diagnosis not present

## 2017-10-25 DIAGNOSIS — Z823 Family history of stroke: Secondary | ICD-10-CM | POA: Diagnosis not present

## 2017-10-25 DIAGNOSIS — K648 Other hemorrhoids: Secondary | ICD-10-CM | POA: Diagnosis not present

## 2017-10-25 DIAGNOSIS — R072 Precordial pain: Secondary | ICD-10-CM

## 2017-10-25 DIAGNOSIS — Z836 Family history of other diseases of the respiratory system: Secondary | ICD-10-CM | POA: Diagnosis not present

## 2017-10-25 DIAGNOSIS — Z79890 Hormone replacement therapy (postmenopausal): Secondary | ICD-10-CM | POA: Diagnosis not present

## 2017-10-25 HISTORY — DX: Other cervical disc degeneration, unspecified cervical region: M50.30

## 2017-10-25 LAB — URINALYSIS, ROUTINE W REFLEX MICROSCOPIC
Bilirubin Urine: NEGATIVE
Glucose, UA: NEGATIVE mg/dL
HGB URINE DIPSTICK: NEGATIVE
KETONES UR: NEGATIVE mg/dL
LEUKOCYTES UA: NEGATIVE
Nitrite: NEGATIVE
PROTEIN: NEGATIVE mg/dL
Specific Gravity, Urine: <1.005 — ABNORMAL LOW (ref 1.005–1.030)
pH: 6.5 (ref 5.0–8.0)

## 2017-10-25 LAB — CBC WITH DIFFERENTIAL/PLATELET
BASOS PCT: 0 %
Basophils Absolute: 0 10*3/uL (ref 0.0–0.1)
EOS PCT: 0 %
Eosinophils Absolute: 0 10*3/uL (ref 0.0–0.7)
HCT: 39.5 % (ref 36.0–46.0)
HEMOGLOBIN: 13.3 g/dL (ref 12.0–15.0)
LYMPHS ABS: 0.7 10*3/uL (ref 0.7–4.0)
Lymphocytes Relative: 5 %
MCH: 30.2 pg (ref 26.0–34.0)
MCHC: 33.7 g/dL (ref 30.0–36.0)
MCV: 89.8 fL (ref 78.0–100.0)
MONO ABS: 1 10*3/uL (ref 0.1–1.0)
MONOS PCT: 7 %
NEUTROS PCT: 88 %
Neutro Abs: 12.2 10*3/uL — ABNORMAL HIGH (ref 1.7–7.7)
Platelets: 222 10*3/uL (ref 150–400)
RBC: 4.4 MIL/uL (ref 3.87–5.11)
RDW: 12.8 % (ref 11.5–15.5)
WBC: 13.9 10*3/uL — ABNORMAL HIGH (ref 4.0–10.5)

## 2017-10-25 LAB — COMPREHENSIVE METABOLIC PANEL
ALK PHOS: 79 U/L (ref 38–126)
ALT: 24 U/L (ref 14–54)
AST: 35 U/L (ref 15–41)
Albumin: 3.8 g/dL (ref 3.5–5.0)
Anion gap: 10 (ref 5–15)
BUN: 12 mg/dL (ref 6–20)
CO2: 19 mmol/L — ABNORMAL LOW (ref 22–32)
Calcium: 8.6 mg/dL — ABNORMAL LOW (ref 8.9–10.3)
Chloride: 108 mmol/L (ref 101–111)
Creatinine, Ser: 0.67 mg/dL (ref 0.44–1.00)
Glucose, Bld: 175 mg/dL — ABNORMAL HIGH (ref 65–99)
Potassium: 3.4 mmol/L — ABNORMAL LOW (ref 3.5–5.1)
SODIUM: 137 mmol/L (ref 135–145)
TOTAL PROTEIN: 6.1 g/dL — AB (ref 6.5–8.1)
Total Bilirubin: 0.4 mg/dL (ref 0.3–1.2)

## 2017-10-25 LAB — I-STAT CG4 LACTIC ACID, ED
LACTIC ACID, VENOUS: 2.34 mmol/L — AB (ref 0.5–1.9)
LACTIC ACID, VENOUS: 1.97 mmol/L — AB (ref 0.5–1.9)

## 2017-10-25 LAB — D-DIMER, QUANTITATIVE: D-Dimer, Quant: <0.27 ug/mL-FEU (ref 0.00–0.50)

## 2017-10-25 LAB — TROPONIN I: Troponin I: <0.03 ng/mL (ref <0.03)

## 2017-10-25 MED ORDER — PIPERACILLIN-TAZOBACTAM 3.375 G IVPB
3.3750 g | Freq: Three times a day (TID) | INTRAVENOUS | Status: DC
  Administered 2017-10-26: 3.375 g via INTRAVENOUS
  Filled 2017-10-25: qty 50

## 2017-10-25 MED ORDER — SODIUM CHLORIDE 0.9 % IV BOLUS (SEPSIS)
1000.0000 mL | Freq: Once | INTRAVENOUS | Status: AC
  Administered 2017-10-26: 1000 mL via INTRAVENOUS

## 2017-10-25 MED ORDER — VANCOMYCIN HCL 500 MG IV SOLR
500.0000 mg | Freq: Two times a day (BID) | INTRAVENOUS | Status: DC
  Administered 2017-10-26: 500 mg via INTRAVENOUS
  Filled 2017-10-25 (×3): qty 500

## 2017-10-25 MED ORDER — VANCOMYCIN HCL IN DEXTROSE 1-5 GM/200ML-% IV SOLN
1000.0000 mg | Freq: Once | INTRAVENOUS | Status: AC
  Administered 2017-10-25: 1000 mg via INTRAVENOUS
  Filled 2017-10-25: qty 200

## 2017-10-25 MED ORDER — PIPERACILLIN-TAZOBACTAM 3.375 G IVPB 30 MIN
3.3750 g | Freq: Once | INTRAVENOUS | Status: AC
  Administered 2017-10-25: 3.375 g via INTRAVENOUS
  Filled 2017-10-25 (×2): qty 50

## 2017-10-25 NOTE — ED Triage Notes (Signed)
Pt c/o palpitations, "shallow" breathing, and nausea x 4 hours PTA.

## 2017-10-25 NOTE — Progress Notes (Signed)
Pharmacy Antibiotic Note  Victoria Peters is a 71 y.o. female admitted on 10/25/2017 with sepsis.  Pharmacy has been consulted for vancomycin and zosyn dosing. Pt is afebrile and WBC is elevated at 13.9. SCr is WNL at 0.67 and lactic acid is elevated at 2.34.   Plan: Vancomycin 1gm IV x 1 then 500mg  IV Q12H Zosyn 3.375gm IV Q8H (4 hr inf) F/u renal fxn, C&S, clinical status and trough at SS  Height: 5\' 2"  (157.5 cm) Weight: 126 lb (57.2 kg) IBW/kg (Calculated) : 50.1  Temp (24hrs), Avg:98.2 F (36.8 C), Min:98.2 F (36.8 C), Max:98.2 F (36.8 C)  Recent Labs  Lab 10/25/17 1926 10/25/17 1931  WBC 13.9*  --   CREATININE 0.67  --   LATICACIDVEN  --  2.34*    Estimated Creatinine Clearance: 50.3 mL/min (by C-G formula based on SCr of 0.67 mg/dL).    Allergies  Allergen Reactions  . Codeine Nausea Only    Antimicrobials this admission: vanc 12/23>> Zosyn 12/23>>  Dose adjustments this admission: N/A  Microbiology results: Pending  Thank you for allowing pharmacy to be a part of this patient's care.  Kemiya Batdorf, Rande Lawman 10/25/2017 8:12 PM

## 2017-10-25 NOTE — ED Notes (Signed)
Panic LAC 68f 2.34 hand delivered to Dr. Laverta Baltimore @ [phone]

## 2017-10-25 NOTE — ED Notes (Addendum)
Pt returned from XR. 1L bag of NS hung as bolus.

## 2017-10-25 NOTE — ED Provider Notes (Signed)
Emergency Department Provider Note   I have reviewed the triage vital signs and the nursing notes.   HISTORY  Chief Complaint Palpitations   HPI Victoria Peters is a 71 y.o. female with PMH of GERD and HLD to the emergency department for evaluation of her palpitations with rapid breathing and chest discomfort.  Patient has experienced some associated nausea.  Symptoms began approximately 4 hours prior to arrival.  Patient states she had a busy day and was out shopping when she returned home the symptoms began.  She also reports some shaking chills and subjective fevers.  She did not measure any temperature at home.  She has multiple grandchildren living with her currently and they are frequently having URI symptoms but no one is severely ill at this time.  She denies any pleuritic component to chest pain. No abdominal symptoms.    Past Medical History:  Diagnosis Date  . Degenerative cervical disc   . Diverticulosis 11/2003  . Duodenal diverticulum   . Esophageal stricture   . External hemorrhoids   . Gastritis   . GERD (gastroesophageal reflux disease)   . Helicobacter pylori gastritis   . Hiatal hernia   . Hyperlipemia   . Hypothyroid 24  . IBS (irritable bowel syndrome)   . Internal hemorrhoids   . Renal cyst   . Thyroid disease     Patient Active Problem List   Diagnosis Date Noted  . SIRS (systemic inflammatory response syndrome) ([place]) 10/25/2017  . Muscle ache 04/06/2016  . Atypical chest pain 07/26/2014  . Hyperlipidemia 07/26/2014  . Hypothyroid   . High cholesterol   . GASTRITIS, ACUTE 12/16/2010    Past Surgical History:  Procedure Laterality Date  . DILATION AND CURETTAGE OF UTERUS     x 2  . LAPAROSCOPY  1981  . NOSE SURGERY     Deviated Septum       Allergies Codeine  Family History  Problem Relation Age of Onset  . Heart disease Brother   . Lung cancer Father   . Osteoporosis Mother   . Colon polyps Brother   . Colon cancer Unknown          grandmother???  . COPD Unknown   . Osteoporosis Sister   . CVA Maternal Grandmother   . CVA Maternal Grandfather   . CVA Paternal Grandmother   . Heart attack Paternal Grandfather 70    Social History Social History   Tobacco Use  . Smoking status: Former Smoker    Packs/day: 1.00    Years: 7.00    Pack years: 7.00    Last attempt to quit: 11/03/1966    Years since quitting: 51.0  . Smokeless tobacco: Never Used  Substance Use Topics  . Alcohol use: No  . Drug use: No    Review of Systems  Constitutional: Positive fever/chills Eyes: No visual changes. ENT: No sore throat. Cardiovascular: Positive chest pain and palpitations.  Respiratory: Positive shortness of breath. Gastrointestinal: No abdominal pain. Positive nausea, no vomiting.  No diarrhea.  No constipation. Genitourinary: Negative for dysuria. Musculoskeletal: Negative for back pain. Skin: Negative for rash. Neurological: Negative for headaches, focal weakness or numbness.  10-point ROS otherwise negative.  ____________________________________________   PHYSICAL EXAM:  VITAL SIGNS: ED Triage Vitals  Enc Vitals Group     BP 10/25/17 1917 128/73     Pulse Rate 10/25/17 1917 (!) 118     Resp 10/25/17 1917 (!) 24     Temp 10/25/17 1917  98.2 F (36.8 C)     Temp Source 10/25/17 1917 Oral     SpO2 10/25/17 1917 97 %     Weight 10/25/17 1912 126 lb (57.2 kg)     Height 10/25/17 1912 5\' 2"  (1.575 m)     Pain Score 10/25/17 1911 4   Constitutional: Alert and oriented. Well appearing and in no acute distress. Eyes: Conjunctivae are normal.  Head: Atraumatic. Nose: No congestion/rhinnorhea. Mouth/Throat: Mucous membranes are moist.   Neck: No stridor.  Cardiovascular: Sinus tachycardia. Good peripheral circulation. Grossly normal heart sounds.   Respiratory: Normal respiratory effort.  No retractions. Lungs CTAB. Gastrointestinal: Soft and nontender. No distention.  Musculoskeletal: No lower  extremity tenderness nor edema. No gross deformities of extremities. Neurologic:  Normal speech and language. No gross focal neurologic deficits are appreciated.  Skin:  Skin is warm, dry and intact. No rash noted.  ____________________________________________   LABS (all labs ordered are listed, but only abnormal results are displayed)  Labs Reviewed  COMPREHENSIVE METABOLIC PANEL - Abnormal; Notable for the following components:      Result Value   Potassium 3.4 (*)    CO2 19 (*)    Glucose, Bld 175 (*)    Calcium 8.6 (*)    Total Protein 6.1 (*)    All other components within normal limits  CBC WITH DIFFERENTIAL/PLATELET - Abnormal; Notable for the following components:   WBC 13.9 (*)    Neutro Abs 12.2 (*)    All other components within normal limits  URINALYSIS, ROUTINE W REFLEX MICROSCOPIC - Abnormal; Notable for the following components:   Specific Gravity, Urine <1.005 (*)    All other components within normal limits  I-STAT CG4 LACTIC ACID, ED - Abnormal; Notable for the following components:   Lactic Acid, Venous 2.34 (*)    All other components within normal limits  I-STAT CG4 LACTIC ACID, ED - Abnormal; Notable for the following components:   Lactic Acid, Venous 1.97 (*)    All other components within normal limits  CULTURE, BLOOD (ROUTINE X 2)  CULTURE, BLOOD (ROUTINE X 2)  TROPONIN I  D-DIMER, QUANTITATIVE (NOT AT [facility])  INFLUENZA PANEL BY PCR (TYPE A & B)   ____________________________________________  EKG   EKG Interpretation  Date/Time:  Sunday October 25 2017 19:13:40 EST Ventricular Rate:  119 PR Interval:    QRS Duration: 92 QT Interval:  346 QTC Calculation: 487 R Axis:   -3 Text Interpretation:  Sinus tachycardia Low voltage, precordial leads Borderline prolonged QT interval Baseline wander in lead(s) V2 No STEMI.  Confirmed by Nanda Quinton [phone]) on 10/25/2017 7:17:00 PM        ____________________________________________  RADIOLOGY  Dg Chest 2 View  Result Date: 10/25/2017 CLINICAL DATA:  Acute left-sided chest pain and shortness of breath 4 hours. Nausea and headache. EXAM: CHEST  2 VIEW COMPARISON:  03/25/2014 FINDINGS: Lungs are adequately inflated without focal airspace consolidation or effusion. Cardiomediastinal silhouette and remainder of the exam is unchanged. IMPRESSION: No active cardiopulmonary disease. Electronically Signed   By: Marin Olp M.D.   On: 10/25/2017 19:50    ____________________________________________   PROCEDURES  Procedure(s) performed:   Procedures  CRITICAL CARE Performed by: Margette Fast Total critical care time: 33 minutes Critical care time was exclusive of separately billable procedures and treating other patients. Critical care was necessary to treat or prevent imminent or life-threatening deterioration. Critical care was time spent personally by me on the following activities: development of treatment  plan with patient and/or surrogate as well as nursing, discussions with consultants, evaluation of patient's response to treatment, examination of patient, obtaining history from patient or surrogate, ordering and performing treatments and interventions, ordering and review of laboratory studies, ordering and review of radiographic studies, pulse oximetry and re-evaluation of patient's condition.  Nanda Quinton, MD Emergency Medicine  ____________________________________________   INITIAL IMPRESSION / ASSESSMENT AND PLAN / ED COURSE  Pertinent labs & imaging results that were available during my care of the patient were reviewed by me and considered in my medical decision making (see chart for details).  Presents the emergency department for evaluation of palpitations, dyspnea, nausea.  She reports what sounds like rigors and subjective fevers at home prior to arrival.  My primary concern is for infectious  etiology however will obtain d-dimer given her primarily breathing symptoms with tachycardia a relatively sudden onset. Some mild CP currently.  Differential diagnosis for adult fever includes but is not exclusive to community-acquired pneumonia, urinary tract infection, acute cholecystitis, viral syndrome, cellulitis, tick bourne disease,  decubitus ulcer, necrotizing fasciitis, meningitis, encephalitis, influenza, etc.  Differential includes all life-threatening causes for chest pain. This includes but is not exclusive to acute coronary syndrome, aortic dissection, pulmonary embolism, cardiac tamponade, community-acquired pneumonia, pericarditis, musculoskeletal chest wall pain, etc.  Elevated lactate and WBC count with left shift. Suspect respiratory etiology. Plan for IVF, broad spectrum abx with continued tachycardia. Plan for admission with concern for early sepsis and need for additional HR control. Flu pending. D-dimer negative.   10:13 PM Discussed patient's case with Hospitalist, Dr. Hal Hope to request admission. Patient and family (if present) updated with plan. Care transferred to Hospitalist service.  I reviewed all nursing notes, vitals, pertinent old records, EKGs, labs, imaging (as available).  ____________________________________________  FINAL CLINICAL IMPRESSION(S) / ED DIAGNOSES  Final diagnoses:  Sepsis, due to unspecified organism ([place])  Precordial chest pain  Shortness of breath     MEDICATIONS GIVEN DURING THIS VISIT:  Medications  sodium chloride 0.9 % bolus 1,000 mL (0 mLs Intravenous Stopped 10/25/17 2021)  piperacillin-tazobactam (ZOSYN) IVPB 3.375 g (not administered)  vancomycin (VANCOCIN) 500 mg in sodium chloride 0.9 % 100 mL IVPB (not administered)  piperacillin-tazobactam (ZOSYN) IVPB 3.375 g (3.375 g Intravenous New Bag/Given 10/25/17 2100)  vancomycin (VANCOCIN) IVPB 1000 mg/200 mL premix (1,000 mg Intravenous New Bag/Given 10/25/17 2100)     Note:  This document was prepared using Dragon voice recognition software and may include unintentional dictation errors.  Nanda Quinton, MD Emergency Medicine    Javonna Balli, [facility], MD 10/25/17 [phone]

## 2017-10-25 NOTE — Progress Notes (Signed)
RN notified [facility] Admissions that the patient was admitted from [facility]. Awaiting any new orders/answer back etc.

## 2017-10-26 ENCOUNTER — Other Ambulatory Visit: Payer: Self-pay

## 2017-10-26 DIAGNOSIS — E039 Hypothyroidism, unspecified: Secondary | ICD-10-CM

## 2017-10-26 DIAGNOSIS — A419 Sepsis, unspecified organism: Secondary | ICD-10-CM | POA: Diagnosis not present

## 2017-10-26 DIAGNOSIS — R6510 Systemic inflammatory response syndrome (SIRS) of non-infectious origin without acute organ dysfunction: Secondary | ICD-10-CM

## 2017-10-26 LAB — CBC
HEMATOCRIT: 36.9 % (ref 36.0–46.0)
HEMOGLOBIN: 11.8 g/dL — AB (ref 12.0–15.0)
MCH: 28.8 pg (ref 26.0–34.0)
MCHC: 32 g/dL (ref 30.0–36.0)
MCV: 90 fL (ref 78.0–100.0)
Platelets: 177 10*3/uL (ref 150–400)
RBC: 4.1 MIL/uL (ref 3.87–5.11)
RDW: 12.9 % (ref 11.5–15.5)
WBC: 10.7 10*3/uL — AB (ref 4.0–10.5)

## 2017-10-26 LAB — INFLUENZA PANEL BY PCR (TYPE A & B)
INFLAPCR: NEGATIVE
INFLBPCR: NEGATIVE

## 2017-10-26 LAB — BASIC METABOLIC PANEL
ANION GAP: 4 — AB (ref 5–15)
BUN: 8 mg/dL (ref 6–20)
CHLORIDE: 114 mmol/L — AB (ref 101–111)
CO2: 22 mmol/L (ref 22–32)
Calcium: 8 mg/dL — ABNORMAL LOW (ref 8.9–10.3)
Creatinine, Ser: 0.6 mg/dL (ref 0.44–1.00)
GFR calc non Af Amer: >60 mL/min (ref >60)
Glucose, Bld: 102 mg/dL — ABNORMAL HIGH (ref 65–99)
POTASSIUM: 3.3 mmol/L — AB (ref 3.5–5.1)
SODIUM: 140 mmol/L (ref 135–145)

## 2017-10-26 LAB — LACTIC ACID, PLASMA
Lactic Acid, Venous: 0.9 mmol/L (ref 0.5–1.9)
Lactic Acid, Venous: 0.8 mmol/L (ref 0.5–1.9)

## 2017-10-26 MED ORDER — SODIUM CHLORIDE 0.9 % IV BOLUS (SEPSIS)
1000.0000 mL | Freq: Once | INTRAVENOUS | Status: AC
  Administered 2017-10-26: 1000 mL via INTRAVENOUS

## 2017-10-26 MED ORDER — ENOXAPARIN SODIUM 40 MG/0.4ML ~~LOC~~ SOLN
40.0000 mg | Freq: Every day | SUBCUTANEOUS | Status: DC
  Administered 2017-10-26: 40 mg via SUBCUTANEOUS
  Filled 2017-10-26: qty 0.4

## 2017-10-26 MED ORDER — HYOSCYAMINE SULFATE 0.125 MG SL SUBL
0.1250 mg | SUBLINGUAL_TABLET | Freq: Four times a day (QID) | SUBLINGUAL | Status: DC | PRN
  Filled 2017-10-26: qty 1

## 2017-10-26 MED ORDER — ONDANSETRON HCL 4 MG PO TABS: 4.0000 mg | ORAL_TABLET | Freq: Four times a day (QID) | ORAL | Status: DC | PRN

## 2017-10-26 MED ORDER — LEVOTHYROXINE SODIUM 50 MCG PO TABS
75.0000 ug | ORAL_TABLET | Freq: Every day | ORAL | Status: DC
  Administered 2017-10-26: 09:00:00 75 ug via ORAL
  Filled 2017-10-26: qty 1

## 2017-10-26 MED ORDER — ONDANSETRON HCL 4 MG/2ML IJ SOLN: 4.0000 mg | Freq: Four times a day (QID) | INTRAMUSCULAR | Status: DC | PRN

## 2017-10-26 MED ORDER — SODIUM CHLORIDE 0.9% FLUSH
3.0000 mL | Freq: Two times a day (BID) | INTRAVENOUS | Status: DC
  Administered 2017-10-26 (×2): 3 mL via INTRAVENOUS

## 2017-10-26 MED ORDER — ROSUVASTATIN CALCIUM 10 MG PO TABS
10.0000 mg | ORAL_TABLET | ORAL | Status: DC
  Administered 2017-10-26: 10 mg via ORAL
  Filled 2017-10-26: qty 1

## 2017-10-26 MED ORDER — ACETAMINOPHEN 650 MG RE SUPP: 650.0000 mg | Freq: Four times a day (QID) | RECTAL | Status: DC | PRN

## 2017-10-26 MED ORDER — PANTOPRAZOLE SODIUM 40 MG PO TBEC
40.0000 mg | DELAYED_RELEASE_TABLET | Freq: Every day | ORAL | Status: DC
  Administered 2017-10-26: 40 mg via ORAL
  Filled 2017-10-26: qty 1

## 2017-10-26 MED ORDER — ACETAMINOPHEN 325 MG PO TABS
650.0000 mg | ORAL_TABLET | Freq: Four times a day (QID) | ORAL | Status: DC | PRN
  Administered 2017-10-26: 325 mg via ORAL
  Filled 2017-10-26: qty 2

## 2017-10-26 MED ORDER — HYDRALAZINE HCL 20 MG/ML IJ SOLN: 10.0000 mg | Freq: Three times a day (TID) | INTRAMUSCULAR | Status: DC | PRN

## 2017-10-26 MED ORDER — POTASSIUM CHLORIDE CRYS ER 20 MEQ PO TBCR
30.0000 meq | EXTENDED_RELEASE_TABLET | ORAL | Status: AC
  Administered 2017-10-26 (×2): 30 meq via ORAL
  Filled 2017-10-26 (×2): qty 3

## 2017-10-26 MED ORDER — SODIUM CHLORIDE 0.9 % IV SOLN
INTRAVENOUS | Status: AC
  Administered 2017-10-26: 02:00:00 via INTRAVENOUS

## 2017-10-26 NOTE — H&P (Signed)
[facility] History and Physical  Victoria Peters YTK:[id number] DOB: 1946-07-15 DOA: 10/25/2017  Referring physician:  PCP: Victoria Gravel, MD   Chief Complaint: "My heart was racing."  HPI: Victoria Peters is a 71 y.o. female with past medical history of thyroid disease, IBS, reflux presents racing heartbeat.  Patient states she has been ill for the last few days.  Has been around multiple family members with upper respiratory tract infection symptoms.  Slightly less eating and drinking normal.  Prior to come to the emergency room had racing heartbeat.  Felt feverish.  Also severe chills.  Nausea but no vomiting.  Denies diarrhea or rash.  ED course: Patient found to have elevated lactic acid.  Sepsis protocol initiated.  Chest x-ray negative.  Hospitalist consulted for admission.   Review of Systems:  As per HPI otherwise 10 point review of systems negative.    Past Medical History:  Diagnosis Date  . Degenerative cervical disc   . Diverticulosis 11/2003  . Duodenal diverticulum   . Esophageal stricture   . External hemorrhoids   . Gastritis   . GERD (gastroesophageal reflux disease)   . Helicobacter pylori gastritis   . Hiatal hernia   . Hyperlipemia   . Hypothyroid 27  . IBS (irritable bowel syndrome)   . Internal hemorrhoids   . Renal cyst   . Thyroid disease    Past Surgical History:  Procedure Laterality Date  . DILATION AND CURETTAGE OF UTERUS     x 2  . LAPAROSCOPY  1981  . NOSE SURGERY     Deviated Septum    Social History:  reports that she quit smoking about 51 years ago. She has a 7.00 pack-year smoking history. she has never used smokeless tobacco. She reports that she does not drink alcohol or use drugs.  Allergies  Allergen Reactions  . Codeine Nausea Only    Family History  Problem Relation Age of Onset  . Heart disease Brother   . Lung cancer Father   . Osteoporosis Mother   . Colon polyps Brother   . Colon cancer Unknown    grandmother???  . COPD Unknown   . Osteoporosis Sister   . CVA Maternal Grandmother   . CVA Maternal Grandfather   . CVA Paternal Grandmother   . Heart attack Paternal Grandfather 72     Prior to Admission medications   Medication Sig Start Date End Date Taking? Authorizing Provider  hyoscyamine (LEVSIN SL) 0.125 MG SL tablet Take one tablet sl every 4-6 hours as needed for Abdominal pain and cramping 07/14/17   Victoria Salon, MD  levothyroxine (SYNTHROID, LEVOTHROID) 75 MCG tablet  06/19/17   [provider]  Omega-3 Fatty Acids (OMEGA 3 PO) Take 1 capsule by mouth daily.      [provider]  pantoprazole (PROTONIX) 40 MG tablet TK 1 T PO  ONCE D 06/15/17   [provider]  Probiotic Product (PROBIOTIC DAILY PO) Take by mouth.    [provider]  rosuvastatin (CRESTOR) 10 MG tablet Take by mouth 2 (two) times a week.  04/21/16   [provider]   Physical Exam: Vitals:   10/25/17 1917 10/25/17 1938 10/25/17 2000 10/25/17 2030  BP: 128/73 124/69 118/64 121/64  Pulse: (!) 118  100 (!) 104  Resp: (!) 24  13 16   Temp: 98.2 F (36.8 C)     TempSrc: Oral     SpO2: 97%  97% 97%  Weight:  Height:        Wt Readings from Last 3 Encounters:  10/25/17 57.2 kg (126 lb)  07/14/17 58.5 kg (129 lb)  06/12/16 59.9 kg (132 lb)    General:  Appears calm and comfortable; a&Ox3, dry mouth Eyes:  PERRL, EOMI, normal lids, iris ENT:  grossly normal hearing, lips & tongue Neck:  no LAD, masses or thyromegaly Cardiovascular:  RRR, no m/r/g. No LE edema.  Respiratory:  CTA bilaterally, no w/r/r. Normal respiratory effort. Abdomen:  soft, ntnd Skin:  no rash or induration seen on limited exam Musculoskeletal:  grossly normal tone BUE/BLE Psychiatric:  grossly normal mood and affect, speech fluent and appropriate Neurologic:  CN 2-12 grossly intact, moves all extremities in coordinated fashion.          Labs on Admission:  Basic Metabolic  Panel: Recent Labs  Lab 10/25/17 1926  NA 137  K 3.4*  CL 108  CO2 19*  GLUCOSE 175*  BUN 12  CREATININE 0.67  CALCIUM 8.6*   Liver Function Tests: Recent Labs  Lab 10/25/17 1926  AST 35  ALT 24  ALKPHOS 79  BILITOT 0.4  PROT 6.1*  ALBUMIN 3.8   No results for input(s): LIPASE, AMYLASE in the last 168 hours. No results for input(s): AMMONIA in the last 168 hours. CBC: Recent Labs  Lab 10/25/17 1926  WBC 13.9*  NEUTROABS 12.2*  HGB 13.3  HCT 39.5  MCV 89.8  PLT 222   Cardiac Enzymes: Recent Labs  Lab 10/25/17 1926  TROPONINI <0.03    BNP (last 3 results) No results for input(s): BNP in the last 8760 hours.  ProBNP (last 3 results) No results for input(s): PROBNP in the last 8760 hours.   Serum creatinine: 0.67 mg/dL 10/25/17 1926 Estimated creatinine clearance: 50.3 mL/min  CBG: No results for input(s): GLUCAP in the last 168 hours.  Radiological Exams on Admission: Dg Chest 2 View  Result Date: 10/25/2017 CLINICAL DATA:  Acute left-sided chest pain and shortness of breath 4 hours. Nausea and headache. EXAM: CHEST  2 VIEW COMPARISON:  03/25/2014 FINDINGS: Lungs are adequately inflated without focal airspace consolidation or effusion. Cardiomediastinal silhouette and remainder of the exam is unchanged. IMPRESSION: No active cardiopulmonary disease. Electronically Signed   By: Victoria Peters M.D.   On: 10/25/2017 19:50    EKG: Independently reviewed. Tachy. No stemi.  Assessment/Plan Active Problems:   SIRS (systemic inflammatory response syndrome) ([facility])  Sepsis 2/2 unk Patient hemodynamically stable Given vanc emergency room, will continue Given zosyn in the emergency room, will continue Urine culture pending Blood cultures 2 pending Patient given 1000 mL of fluid in the emergency room Lactic acid up, will trend Flu pending   GERD Cont PPI  Hypothyroidism Cont OP synthroid 75 mcg qd No signs of hyper or  hypothyroidism  Hyperlipidemia Continue statin   Code Status: FC DVT Prophylaxis: lovenox Family Communication: none at bedside Disposition Plan: Pending Improvement  Status: obs  Victoria Mocha, MD Family Medicine [facility] [email or web address] Password [email or web address]

## 2017-10-26 NOTE — Discharge Summary (Addendum)
Physician Discharge Summary  Victoria Peters GLO:[id number] DOB: [birth year] DOA: 10/25/2017  PCP: Jani Gravel, MD  Admit date: 10/25/2017 Discharge date: 10/26/2017  Admitted From: Home Disposition:  Home  Recommendations for Outpatient Follow-up:  1. Follow up with PCP in 1 week   Home Health: No  Equipment/Devices: None   Discharge Condition: Stable  CODE STATUS: Full  Diet recommendation: Heart Healthy   Brief/Interim Summary: 71 year old female with history of thyroid disease, IBS, reflux presented with racing heartbeat. She was found to be tachycardic with elevated lactic acid. She was kept on observation for probable SIRS and started on broad-spectrum antibiotics and IV fluids. Patient feels much better and wants to go home although it is been less than 24 hours into admission.  Discharge Diagnoses:  Active Problems:   SIRS (systemic inflammatory response syndrome) (HCC)  SIRS - Questionable cause, may be viral  -resolved - Initially started on intravenous fluids and antibiotics. Cultures are pending so far -Patient feels much better and wants to go home. -Flu is negative. Lactic acid has improved -Discharge patient home with outpatient follow-up with primary care provider  Sepsis has been ruled out   GERD -Continue PPI  Hypothyroidism - Continue synthroid   Discharge Instructions  Discharge Instructions    Call MD for:  difficulty breathing, headache or visual disturbances   Complete by:  As directed    Call MD for:  extreme fatigue   Complete by:  As directed    Call MD for:  hives   Complete by:  As directed    Call MD for:  persistant dizziness or light-headedness   Complete by:  As directed    Call MD for:  persistant nausea and vomiting   Complete by:  As directed    Call MD for:  severe uncontrolled pain   Complete by:  As directed    Call MD for:  temperature >100.4   Complete by:  As directed    Diet - low sodium heart healthy   Complete by:   As directed    Increase activity slowly   Complete by:  As directed      Allergies as of 10/26/2017      Reactions   Codeine Nausea Only      Medication List    STOP taking these medications   clindamycin 150 MG capsule Commonly known as:  CLEOCIN     TAKE these medications   acetaminophen 500 MG tablet Commonly known as:  TYLENOL Take 500 mg by mouth every 6 (six) hours as needed for mild pain or fever.   hyoscyamine 0.125 MG SL tablet Commonly known as:  LEVSIN SL Take one tablet sl every 4-6 hours as needed for Abdominal pain and cramping   levothyroxine 75 MCG tablet Commonly known as:  SYNTHROID, LEVOTHROID Take 75 mcg by mouth daily before breakfast.   pantoprazole 40 MG tablet Commonly known as:  PROTONIX Take 1 tablet once daily      Follow-up Information    Jani Gravel, MD. Schedule an appointment as soon as possible for a visit in 1 week(s).   Specialty:  Internal Medicine Contact information: [place] [address] [phone]          Allergies  Allergen Reactions  . Codeine Nausea Only    Consultations:  None   Procedures/Studies: Dg Chest 2 View  Result Date: 10/25/2017 CLINICAL DATA:  Acute left-sided chest pain and shortness of breath 4 hours. Nausea and  headache. EXAM: CHEST  2 VIEW COMPARISON:  03/25/2014 FINDINGS: Lungs are adequately inflated without focal airspace consolidation or effusion. Cardiomediastinal silhouette and remainder of the exam is unchanged. IMPRESSION: No active cardiopulmonary disease. Electronically Signed   By: Marin Olp M.D.   On: 10/25/2017 19:50     Subjective: Patient seen and examined at bedside. She feels much better and wants to go home. Her breathing is improved. Overnight fever, nausea or vomiting. She denies current heart racing.  Discharge Exam: Vitals:   10/25/17 2340 10/26/17 0418  BP: (!) 123/54 (!) 116/56  Pulse: 91 90  Resp: 16 16  Temp: 99.3 F (37.4  C) 99.8 F (37.7 C)  SpO2: 99% 95%   Vitals:   10/25/17 2030 10/25/17 2340 10/26/17 0418 10/26/17 0419  BP: 121/64 (!) 123/54 (!) 116/56   Pulse: (!) 104 91 90   Resp: 16 16 16    Temp:  99.3 F (37.4 C) 99.8 F (37.7 C)   TempSrc:  Oral Oral   SpO2: 97% 99% 95%   Weight:  58.9 kg (129 lb 13.6 oz)  59.6 kg (131 lb 6.3 oz)  Height:  5\' 2"  (1.575 m)      General: Pt is alert, awake, not in acute distress Cardiovascular: Rate controlled, S1/S2 + Respiratory: Bilateral decreased breath sounds at bases Abdominal: Soft, NT, ND, bowel sounds + Extremities: no edema, no cyanosis    The results of significant diagnostics from this hospitalization (including imaging, microbiology, ancillary and laboratory) are listed below for reference.     Microbiology: No results found for this or any previous visit (from the past 240 hour(s)).   Labs: BNP (last 3 results) No results for input(s): BNP in the last 8760 hours. Basic Metabolic Panel: Recent Labs  Lab 10/25/17 1926 10/26/17 0406  NA 137 140  K 3.4* 3.3*  CL 108 114*  CO2 19* 22  GLUCOSE 175* 102*  BUN 12 8  CREATININE 0.67 0.60  CALCIUM 8.6* 8.0*   Liver Function Tests: Recent Labs  Lab 10/25/17 1926  AST 35  ALT 24  ALKPHOS 79  BILITOT 0.4  PROT 6.1*  ALBUMIN 3.8   No results for input(s): LIPASE, AMYLASE in the last 168 hours. No results for input(s): AMMONIA in the last 168 hours. CBC: Recent Labs  Lab 10/25/17 1926 10/26/17 0406  WBC 13.9* 10.7*  NEUTROABS 12.2*  --   HGB 13.3 11.8*  HCT 39.5 36.9  MCV 89.8 90.0  PLT 222 177   Cardiac Enzymes: Recent Labs  Lab 10/25/17 1926  TROPONINI <0.03   BNP: Invalid input(s): POCBNP CBG: No results for input(s): GLUCAP in the last 168 hours. D-Dimer Recent Labs    10/25/17 1926  DDIMER <0.27   Hgb A1c No results for input(s): HGBA1C in the last 72 hours. Lipid Profile No results for input(s): CHOL, HDL, LDLCALC, TRIG, CHOLHDL, LDLDIRECT in  the last 72 hours. Thyroid function studies No results for input(s): TSH, T4TOTAL, T3FREE, THYROIDAB in the last 72 hours.  Invalid input(s): FREET3 Anemia work up No results for input(s): VITAMINB12, FOLATE, FERRITIN, TIBC, IRON, RETICCTPCT in the last 72 hours. Urinalysis    Component Value Date/Time   COLORURINE YELLOW 10/25/2017 2037   APPEARANCEUR CLEAR 10/25/2017 2037   LABSPEC <1.005 (L) 10/25/2017 2037   PHURINE 6.5 10/25/2017 2037   GLUCOSEU NEGATIVE 10/25/2017 2037   HGBUR NEGATIVE 10/25/2017 2037   [place] NEGATIVE 10/25/2017 2037   BILIRUBINUR neg 05/20/2016 1334   KETONESUR NEGATIVE 10/25/2017  2037   PROTEINUR NEGATIVE 10/25/2017 2037   UROBILINOGEN negative 05/20/2016 1334   UROBILINOGEN 0.2 08/07/2013 0903   NITRITE NEGATIVE 10/25/2017 2037   LEUKOCYTESUR NEGATIVE 10/25/2017 2037   Sepsis Labs Invalid input(s): PROCALCITONIN,  WBC,  LACTICIDVEN Microbiology No results found for this or any previous visit (from the past 240 hour(s)).   Time coordinating discharge: 30 minutes  SIGNED:   Aline August, MD  [facility] 10/26/2017, 10:08 AM Pager: [phone]  If 7PM-7AM, please contact night-coverage [email or web address] Password [email or web address]

## 2017-10-26 NOTE — Plan of Care (Signed)
  Clinical Measurements: Will remain free from infection 10/26/2017 0859 - Progressing by Emmitt Matthews, Royetta Crochet, RN   Clinical Measurements: Diagnostic test results will improve 10/26/2017 0859 - Progressing by Raianna Slight, Royetta Crochet, RN   Clinical Measurements: Respiratory complications will improve 10/26/2017 0859 - Progressing by Azlynn Mitnick, Royetta Crochet, RN

## 2017-10-31 LAB — CULTURE, BLOOD (ROUTINE X 2)
Culture: NO GROWTH
Culture: NO GROWTH
SPECIAL REQUESTS: ADEQUATE
SPECIAL REQUESTS: ADEQUATE

## 2017-11-02 DIAGNOSIS — Z79899 Other long term (current) drug therapy: Secondary | ICD-10-CM | POA: Diagnosis not present

## 2017-11-02 DIAGNOSIS — E559 Vitamin D deficiency, unspecified: Secondary | ICD-10-CM | POA: Diagnosis not present

## 2017-11-02 DIAGNOSIS — Z09 Encounter for follow-up examination after completed treatment for conditions other than malignant neoplasm: Secondary | ICD-10-CM | POA: Diagnosis not present

## 2017-11-02 DIAGNOSIS — E7800 Pure hypercholesterolemia, unspecified: Secondary | ICD-10-CM | POA: Diagnosis not present

## 2018-01-14 DIAGNOSIS — H43812 Vitreous degeneration, left eye: Secondary | ICD-10-CM | POA: Diagnosis not present

## 2018-01-14 DIAGNOSIS — H02831 Dermatochalasis of right upper eyelid: Secondary | ICD-10-CM | POA: Diagnosis not present

## 2018-01-19 DIAGNOSIS — H02423 Myogenic ptosis of bilateral eyelids: Secondary | ICD-10-CM | POA: Diagnosis not present

## 2018-02-15 DIAGNOSIS — E789 Disorder of lipoprotein metabolism, unspecified: Secondary | ICD-10-CM | POA: Diagnosis not present

## 2018-02-15 DIAGNOSIS — E039 Hypothyroidism, unspecified: Secondary | ICD-10-CM | POA: Diagnosis not present

## 2018-02-21 ENCOUNTER — Other Ambulatory Visit: Payer: Self-pay

## 2018-02-21 ENCOUNTER — Emergency Department (HOSPITAL_BASED_OUTPATIENT_CLINIC_OR_DEPARTMENT_OTHER)
Admission: EM | Admit: 2018-02-21 | Discharge: 2018-02-21 | Disposition: A | Discharge status: Home / Self Care | Payer: Medicare Other | Attending: Emergency Medicine | Admitting: Emergency Medicine

## 2018-02-21 ENCOUNTER — Encounter (HOSPITAL_BASED_OUTPATIENT_CLINIC_OR_DEPARTMENT_OTHER): Payer: Self-pay | Admitting: Emergency Medicine

## 2018-02-21 DIAGNOSIS — K047 Periapical abscess without sinus: Secondary | ICD-10-CM | POA: Diagnosis not present

## 2018-02-21 DIAGNOSIS — Z87891 Personal history of nicotine dependence: Secondary | ICD-10-CM | POA: Insufficient documentation

## 2018-02-21 DIAGNOSIS — M2762 Post-osseointegration biological failure of dental implant: Secondary | ICD-10-CM | POA: Diagnosis not present

## 2018-02-21 DIAGNOSIS — E039 Hypothyroidism, unspecified: Secondary | ICD-10-CM | POA: Diagnosis not present

## 2018-02-21 DIAGNOSIS — Z79899 Other long term (current) drug therapy: Secondary | ICD-10-CM | POA: Diagnosis not present

## 2018-02-21 DIAGNOSIS — K0889 Other specified disorders of teeth and supporting structures: Secondary | ICD-10-CM | POA: Diagnosis not present

## 2018-02-21 HISTORY — DX: Carpal tunnel syndrome, bilateral upper limbs: G56.03

## 2018-02-21 HISTORY — DX: Benign lipomatous neoplasm, unspecified: D17.9

## 2018-02-21 MED ORDER — AMOXICILLIN-POT CLAVULANATE 875-125 MG PO TABS
1.0000 | ORAL_TABLET | Freq: Once | ORAL | Status: AC
  Administered 2018-02-21: 1 via ORAL
  Filled 2018-02-21: qty 1

## 2018-02-21 MED ORDER — AMOXICILLIN-POT CLAVULANATE 875-125 MG PO TABS: 1.0000 | ORAL_TABLET | Freq: Two times a day (BID) | ORAL | 0 refills | Status: DC

## 2018-02-21 NOTE — Discharge Instructions (Signed)
FOLLOW A LIQUID/SOFT DIET UNTIL YOU SEE YOUR SURGEON. USE PERIDEX MOUTH [place] 2-3 TIMES DAILY AND LEAVE DENTURES OUT AS OFTEN AS POSSIBLE. SWISH WITH WARM SALT WATER TO ENCOURAGE DRAINAGE. TAKE ANTIBIOTICS AS PRESCRIBED. RETURN TO ER IF ANY SEVERE SWELLING, FEVERS, OR PROBLEMS SWALLOWING.

## 2018-02-21 NOTE — ED Provider Notes (Signed)
[facility] EMERGENCY DEPARTMENT Provider Note   CSN: [id number] Arrival date & time: 02/21/18  0701     History   Chief Complaint Chief Complaint  Patient presents with  . Dental Pain    HPI Victoria Peters is a 72 y.o. female.  72 year old female with past medical history below who presents with dental pain.  Several months ago, the patient had implants placed in her upper teeth.  She has partial dentures in place until she has permanent teeth placed. Every once in a while food gets stuck in her gums around the implants and causes irritation. For the past day, she's had pain on her L upper gums and swelling/pain near her cheek and maxillary sinus. She denies significant nasal congestion or runny nose.  No fevers.  When she pushes on her cheek she can tell that she is having drainage from her gums.  Her pain has been severe and constant.  She took Tylenol at 2 AM with no relief. No breathing or swallowing problems.  The history is provided by the patient.    Past Medical History:  Diagnosis Date  . Carpal tunnel syndrome on both sides   . Degenerative cervical disc   . Diverticulosis 11/2003  . Duodenal diverticulum   . Esophageal stricture   . External hemorrhoids   . Fatty tumor   . Gastritis   . GERD (gastroesophageal reflux disease)   . Helicobacter pylori gastritis   . Hiatal hernia   . Hyperlipemia   . Hypothyroid 65  . IBS (irritable bowel syndrome)   . Internal hemorrhoids   . Renal cyst   . Thyroid disease     Patient Active Problem List   Diagnosis Date Noted  . SIRS (systemic inflammatory response syndrome) ([place]) 10/25/2017  . Muscle ache 04/06/2016  . Atypical chest pain 07/26/2014  . Hyperlipidemia 07/26/2014  . Hypothyroid   . High cholesterol   . GASTRITIS, ACUTE 12/16/2010    Past Surgical History:  Procedure Laterality Date  . CARPAL TUNNEL RELEASE Bilateral   . DILATION AND CURETTAGE OF UTERUS     x 2  . fatty tumor removal of  right arm Right   . LAPAROSCOPY  1981  . NOSE SURGERY     Deviated Septum      OB History    Gravida  3   Para  2   Term  2   Preterm  0   AB  1   Living  2     SAB  1   TAB  0   Ectopic  0   Multiple  0   Live Births  2            Home Medications    Prior to Admission medications   Medication Sig Start Date End Date Taking? Authorizing Provider  acetaminophen (TYLENOL) 500 MG tablet Take 500 mg by mouth every 6 (six) hours as needed for mild pain or fever.    [provider]  amoxicillin-clavulanate (AUGMENTIN) 875-125 MG tablet Take 1 tablet by mouth every 12 (twelve) hours. 02/21/18   Little, Wenda Overland, MD  hyoscyamine (LEVSIN SL) 0.125 MG SL tablet Take one tablet sl every 4-6 hours as needed for Abdominal pain and cramping 07/14/17   Megan Salon, MD  levothyroxine (SYNTHROID, LEVOTHROID) 75 MCG tablet Take 75 mcg by mouth daily before breakfast.  06/19/17   [provider]  pantoprazole (PROTONIX) 40 MG tablet Take 1 tablet once daily  06/15/17   [provider]    Family History Family History  Problem Relation Age of Onset  . Heart disease Brother   . Lung cancer Father   . Osteoporosis Mother   . Colon polyps Brother   . Colon cancer Unknown        grandmother???  . COPD Unknown   . Osteoporosis Sister   . CVA Maternal Grandmother   . CVA Maternal Grandfather   . CVA Paternal Grandmother   . Heart attack Paternal Grandfather 15    Social History Social History   Tobacco Use  . Smoking status: Former Smoker    Packs/day: 1.00    Years: 7.00    Pack years: 7.00    Last attempt to quit: 11/03/1966    Years since quitting: 51.3  . Smokeless tobacco: Never Used  Substance Use Topics  . Alcohol use: No  . Drug use: No     Allergies   Codeine   Review of Systems Review of Systems All other systems reviewed and are negative except that which was mentioned in HPI  Physical Exam Updated Vital Signs BP  (!) 146/78 (BP Location: Right Arm)   Pulse 71   Temp 98 F (36.7 C) (Oral)   Resp 16   Ht 5\' 2"  (1.575 m)   Wt 58.5 kg (129 lb)   LMP 11/03/2000 (Approximate)   SpO2 100%   BMI 23.59 kg/m   Physical Exam  Constitutional: She is oriented to person, place, and time. She appears well-developed and well-nourished. No distress.  HENT:  Head: Normocephalic and atraumatic.  Mouth/Throat: She has dentures.    Edentulous; implants in place along upper gumline; mild edema above outer edge of L upper gums, able to express pus from around one of the metal implants  Eyes: Conjunctivae are normal.  Neck: Normal range of motion. Neck supple.  Lymphadenopathy:    She has no cervical adenopathy.  Neurological: She is alert and oriented to person, place, and time.  Skin: Skin is warm and dry.  Psychiatric: She has a normal mood and affect. Judgment normal.  Nursing note and vitals reviewed.    ED Treatments / Results  Labs (all labs ordered are listed, but only abnormal results are displayed) Labs Reviewed - No data to display  EKG None  Radiology No results found.  Procedures Procedures (including critical care time)  Medications Ordered in ED Medications  amoxicillin-clavulanate (AUGMENTIN) 875-125 MG per tablet 1 tablet (1 tablet Oral Given 02/21/18 0802)     Initial Impression / Assessment and Plan / ED Course  I have reviewed the triage vital signs and the nursing notes.      Pt afebrile without noticeable facial swelling or redness. Able to express pus from around one of the implants with external pressure to L cheek. Started on antibiotics, she has peridex mouth wash at home and I have recommended using this daily until follow-up with her endodontist.  Have discussed supportive measures including soft/liquid diet, removal of dentures as often as possible, swishing with warm salt water.  Extensively reviewed return precautions.  Final Clinical Impressions(s) / ED  Diagnoses   Final diagnoses:  Failure of dental implant due to periodontal infection (peri-implantitis)    ED Discharge Orders        Ordered    amoxicillin-clavulanate (AUGMENTIN) 875-125 MG tablet  Every 12 hours     02/21/18 0801       Little, Wenda Overland, MD 02/21/18 [phone]

## 2018-02-21 NOTE — ED Triage Notes (Addendum)
Pt c/o left upper toothache believes she may have food stuck between her implant. Pt took 500 mg of tylenol at 0200 without relief

## 2018-02-24 ENCOUNTER — Other Ambulatory Visit: Payer: Self-pay

## 2018-03-02 DIAGNOSIS — K219 Gastro-esophageal reflux disease without esophagitis: Secondary | ICD-10-CM | POA: Diagnosis not present

## 2018-03-02 DIAGNOSIS — G4700 Insomnia, unspecified: Secondary | ICD-10-CM | POA: Diagnosis not present

## 2018-03-02 DIAGNOSIS — E039 Hypothyroidism, unspecified: Secondary | ICD-10-CM | POA: Diagnosis not present

## 2018-03-02 DIAGNOSIS — E789 Disorder of lipoprotein metabolism, unspecified: Secondary | ICD-10-CM | POA: Diagnosis not present

## 2018-03-11 ENCOUNTER — Other Ambulatory Visit: Payer: Self-pay | Admitting: Gastroenterology

## 2018-03-11 DIAGNOSIS — R1033 Periumbilical pain: Secondary | ICD-10-CM | POA: Diagnosis not present

## 2018-03-11 DIAGNOSIS — K219 Gastro-esophageal reflux disease without esophagitis: Secondary | ICD-10-CM | POA: Diagnosis not present

## 2018-03-11 DIAGNOSIS — Z800 Family history of malignant neoplasm of digestive organs: Secondary | ICD-10-CM | POA: Diagnosis not present

## 2018-03-11 DIAGNOSIS — R112 Nausea with vomiting, unspecified: Secondary | ICD-10-CM

## 2018-03-11 DIAGNOSIS — K5904 Chronic idiopathic constipation: Secondary | ICD-10-CM | POA: Diagnosis not present

## 2018-03-17 ENCOUNTER — Ambulatory Visit
Admission: RE | Admit: 2018-03-17 | Discharge: 2018-03-17 | Disposition: A | Discharge status: Home / Self Care | Payer: Medicare Other | Source: Ambulatory Visit | Attending: Gastroenterology | Admitting: Gastroenterology

## 2018-03-17 DIAGNOSIS — K5730 Diverticulosis of large intestine without perforation or abscess without bleeding: Secondary | ICD-10-CM | POA: Diagnosis not present

## 2018-03-17 DIAGNOSIS — R112 Nausea with vomiting, unspecified: Secondary | ICD-10-CM

## 2018-03-17 MED ORDER — IOPAMIDOL (ISOVUE-300) INJECTION 61%
100.0000 mL | Freq: Once | INTRAVENOUS | Status: AC | PRN
  Administered 2018-03-17: 100 mL via INTRAVENOUS

## 2018-03-21 ENCOUNTER — Encounter (HOSPITAL_BASED_OUTPATIENT_CLINIC_OR_DEPARTMENT_OTHER): Payer: Self-pay | Admitting: Emergency Medicine

## 2018-03-21 ENCOUNTER — Emergency Department (HOSPITAL_BASED_OUTPATIENT_CLINIC_OR_DEPARTMENT_OTHER)
Admission: EM | Admit: 2018-03-21 | Discharge: 2018-03-21 | Disposition: A | Discharge status: Home / Self Care | Payer: Medicare Other | Attending: Emergency Medicine | Admitting: Emergency Medicine

## 2018-03-21 ENCOUNTER — Other Ambulatory Visit: Payer: Self-pay

## 2018-03-21 DIAGNOSIS — K0889 Other specified disorders of teeth and supporting structures: Secondary | ICD-10-CM | POA: Diagnosis present

## 2018-03-21 DIAGNOSIS — E079 Disorder of thyroid, unspecified: Secondary | ICD-10-CM | POA: Diagnosis not present

## 2018-03-21 DIAGNOSIS — E039 Hypothyroidism, unspecified: Secondary | ICD-10-CM | POA: Diagnosis not present

## 2018-03-21 DIAGNOSIS — K047 Periapical abscess without sinus: Secondary | ICD-10-CM | POA: Insufficient documentation

## 2018-03-21 DIAGNOSIS — Z87891 Personal history of nicotine dependence: Secondary | ICD-10-CM | POA: Diagnosis not present

## 2018-03-21 DIAGNOSIS — Z79899 Other long term (current) drug therapy: Secondary | ICD-10-CM | POA: Diagnosis not present

## 2018-03-21 MED ORDER — AMOXICILLIN-POT CLAVULANATE 875-125 MG PO TABS: 1.0000 | ORAL_TABLET | Freq: Two times a day (BID) | ORAL | 0 refills | Status: AC

## 2018-03-21 NOTE — Discharge Instructions (Signed)
Take antibiotics as prescribed, follow-up with your dentist next week , monitor for fevers , worsening symptoms

## 2018-03-21 NOTE — ED Triage Notes (Signed)
Pt c/o pain to LT upper gum where she has an "implant" that was placed 8 mos ago; pain started Mon

## 2018-03-21 NOTE — ED Provider Notes (Signed)
[facility] EMERGENCY DEPARTMENT Provider Note   CSN: [id number] Arrival date & time: 03/21/18  7062     History   Chief Complaint Chief Complaint  Patient presents with  . Dental Pain    HPI Victoria Peters is a 72 y.o. female.  HPI Patient presents to the emergency room for evaluation of left upper facial pain and swelling.  Patient states she has a dental implant in her left upper maxillary region.  Patient states she is had trouble with inflammation and infection before.  She noticed the symptoms earlier this week.  She went to see her dentist who thought it was just irritation of the gums without infection.  Patient states her symptoms increased over the last couple days.  He has noticed increasing redness and swelling.  She has felt warm and thought she might have a fever although she has not taken her temperature.  She denies any nausea or vomiting.  No difficulty swallowing or breathing. Past Medical History:  Diagnosis Date  . Carpal tunnel syndrome on both sides   . Degenerative cervical disc   . Diverticulosis 11/2003  . Duodenal diverticulum   . Esophageal stricture   . External hemorrhoids   . Fatty tumor   . Gastritis   . GERD (gastroesophageal reflux disease)   . Helicobacter pylori gastritis   . Hiatal hernia   . Hyperlipemia   . Hypothyroid 37  . IBS (irritable bowel syndrome)   . Internal hemorrhoids   . Renal cyst   . Thyroid disease     Patient Active Problem List   Diagnosis Date Noted  . SIRS (systemic inflammatory response syndrome) ([place]) 10/25/2017  . Muscle ache 04/06/2016  . Atypical chest pain 07/26/2014  . Hyperlipidemia 07/26/2014  . Hypothyroid   . High cholesterol   . GASTRITIS, ACUTE 12/16/2010    Past Surgical History:  Procedure Laterality Date  . CARPAL TUNNEL RELEASE Bilateral   . DILATION AND CURETTAGE OF UTERUS     x 2  . fatty tumor removal of right arm Right   . LAPAROSCOPY  1981  . NOSE SURGERY     Deviated Septum      OB History    Gravida  3   Para  2   Term  2   Preterm  0   AB  1   Living  2     SAB  1   TAB  0   Ectopic  0   Multiple  0   Live Births  2            Home Medications    Prior to Admission medications   Medication Sig Start Date End Date Taking? Authorizing Provider  acetaminophen (TYLENOL) 500 MG tablet Take 500 mg by mouth every 6 (six) hours as needed for mild pain or fever.    [provider]  amoxicillin-clavulanate (AUGMENTIN) 875-125 MG tablet Take 1 tablet by mouth 2 (two) times daily for 7 days. 03/21/18 03/28/18  Dorie Rank, MD  hyoscyamine (LEVSIN SL) 0.125 MG SL tablet Take one tablet sl every 4-6 hours as needed for Abdominal pain and cramping 07/14/17   Megan Salon, MD  levothyroxine (SYNTHROID, LEVOTHROID) 75 MCG tablet Take 50 mcg by mouth daily before breakfast.  06/19/17   [provider]  pantoprazole (PROTONIX) 40 MG tablet Take 1 tablet once daily 06/15/17   [provider]    Family History Family History  Problem Relation Age of  Onset  . Heart disease Brother   . Lung cancer Father   . Osteoporosis Mother   . Colon polyps Brother   . Colon cancer Unknown        grandmother???  . COPD Unknown   . Osteoporosis Sister   . CVA Maternal Grandmother   . CVA Maternal Grandfather   . CVA Paternal Grandmother   . Heart attack Paternal Grandfather 74    Social History Social History   Tobacco Use  . Smoking status: Former Smoker    Packs/day: 1.00    Years: 7.00    Pack years: 7.00    Last attempt to quit: 11/03/1966    Years since quitting: 51.4  . Smokeless tobacco: Never Used  Substance Use Topics  . Alcohol use: No  . Drug use: No     Allergies   Codeine   Review of Systems Review of Systems  All other systems reviewed and are negative.    Physical Exam Updated Vital Signs BP (!) 147/76 (BP Location: Left Arm)   Pulse 67   Temp 97.8 F (36.6 C) (Oral)   Resp 18    Ht 1.575 m (5\' 2" )   Wt 56.7 kg (125 lb)   LMP 11/03/2000 (Approximate)   SpO2 99%   BMI 22.86 kg/m   Physical Exam  Constitutional: She appears well-developed and well-nourished. No distress.  HENT:  Head: Normocephalic and atraumatic.  Right Ear: External ear normal.  Left Ear: External ear normal.  Mild edema and erythema of the left cheek region, metallic implants noted in the gingival region of the left upper gums, no purulent drainage  Eyes: Conjunctivae are normal. Right eye exhibits no discharge. Left eye exhibits no discharge. No scleral icterus.  Neck: Neck supple. No tracheal deviation present.  No neck swelling, no stiffness, no mass  Cardiovascular: Normal rate.  Pulmonary/Chest: Effort normal. No stridor. No respiratory distress.  Abdominal: She exhibits no distension.  Musculoskeletal: She exhibits no edema.  Neurological: She is alert. Cranial nerve deficit: no gross deficits.  Skin: Skin is warm and dry. No rash noted.  Psychiatric: She has a normal mood and affect.  Nursing note and vitals reviewed.    ED Treatments / Results    Procedures Procedures (including critical care time)  Medications Ordered in ED Medications - No data to display   Initial Impression / Assessment and Plan / ED Course  I have reviewed the triage vital signs and the nursing notes.  Pertinent labs & imaging results that were available during my care of the patient were reviewed by me and considered in my medical decision making (see chart for details).   Patient symptoms are consistent with a dental infection.  No signs of abscess.  Patient is nontoxic and well-appearing no difficulty with her breathing and swallowing.  Plan on discharge home with course of antibiotics.  Follow-up with her dentist closely this week.  Final Clinical Impressions(s) / ED Diagnoses   Final diagnoses:  Dental infection    ED Discharge Orders        Ordered    amoxicillin-clavulanate  (AUGMENTIN) 875-125 MG tablet  2 times daily     03/21/18 0951       Dorie Rank, MD 03/21/18 [phone]

## 2018-03-24 ENCOUNTER — Other Ambulatory Visit: Payer: Self-pay

## 2018-04-28 DIAGNOSIS — E039 Hypothyroidism, unspecified: Secondary | ICD-10-CM | POA: Diagnosis not present

## 2018-08-04 ENCOUNTER — Other Ambulatory Visit: Payer: Self-pay | Admitting: Obstetrics & Gynecology

## 2018-08-04 DIAGNOSIS — Z1231 Encounter for screening mammogram for malignant neoplasm of breast: Secondary | ICD-10-CM

## 2018-08-09 ENCOUNTER — Ambulatory Visit: Payer: Medicare Other

## 2018-08-17 DIAGNOSIS — E039 Hypothyroidism, unspecified: Secondary | ICD-10-CM | POA: Diagnosis not present

## 2018-08-17 DIAGNOSIS — E559 Vitamin D deficiency, unspecified: Secondary | ICD-10-CM | POA: Diagnosis not present

## 2018-08-17 DIAGNOSIS — Z0000 Encounter for general adult medical examination without abnormal findings: Secondary | ICD-10-CM | POA: Diagnosis not present

## 2018-08-20 DIAGNOSIS — E039 Hypothyroidism, unspecified: Secondary | ICD-10-CM | POA: Diagnosis not present

## 2018-08-20 DIAGNOSIS — E7800 Pure hypercholesterolemia, unspecified: Secondary | ICD-10-CM | POA: Diagnosis not present

## 2018-08-20 DIAGNOSIS — Z23 Encounter for immunization: Secondary | ICD-10-CM | POA: Diagnosis not present

## 2018-08-20 DIAGNOSIS — Z0000 Encounter for general adult medical examination without abnormal findings: Secondary | ICD-10-CM | POA: Diagnosis not present

## 2018-08-20 DIAGNOSIS — K219 Gastro-esophageal reflux disease without esophagitis: Secondary | ICD-10-CM | POA: Diagnosis not present

## 2018-08-20 DIAGNOSIS — E559 Vitamin D deficiency, unspecified: Secondary | ICD-10-CM | POA: Diagnosis not present

## 2018-08-27 DIAGNOSIS — B078 Other viral warts: Secondary | ICD-10-CM | POA: Diagnosis not present

## 2018-08-27 DIAGNOSIS — L723 Sebaceous cyst: Secondary | ICD-10-CM | POA: Diagnosis not present

## 2018-08-27 DIAGNOSIS — D225 Melanocytic nevi of trunk: Secondary | ICD-10-CM | POA: Diagnosis not present

## 2018-08-27 DIAGNOSIS — D1801 Hemangioma of skin and subcutaneous tissue: Secondary | ICD-10-CM | POA: Diagnosis not present

## 2018-08-27 DIAGNOSIS — D2261 Melanocytic nevi of right upper limb, including shoulder: Secondary | ICD-10-CM | POA: Diagnosis not present

## 2018-09-06 ENCOUNTER — Ambulatory Visit
Admission: RE | Admit: 2018-09-06 | Discharge: 2018-09-06 | Disposition: A | Discharge status: Home / Self Care | Payer: Medicare Other | Source: Ambulatory Visit | Attending: Obstetrics & Gynecology | Admitting: Obstetrics & Gynecology

## 2018-09-06 DIAGNOSIS — Z1231 Encounter for screening mammogram for malignant neoplasm of breast: Secondary | ICD-10-CM

## 2018-09-08 DIAGNOSIS — M5136 Other intervertebral disc degeneration, lumbar region: Secondary | ICD-10-CM | POA: Diagnosis not present

## 2018-09-08 DIAGNOSIS — M5030 Other cervical disc degeneration, unspecified cervical region: Secondary | ICD-10-CM | POA: Diagnosis not present

## 2018-09-15 DIAGNOSIS — M542 Cervicalgia: Secondary | ICD-10-CM | POA: Diagnosis not present

## 2018-09-17 ENCOUNTER — Ambulatory Visit: Payer: Self-pay | Admitting: Obstetrics & Gynecology

## 2018-10-05 DIAGNOSIS — M542 Cervicalgia: Secondary | ICD-10-CM | POA: Diagnosis not present

## 2018-10-13 DIAGNOSIS — M542 Cervicalgia: Secondary | ICD-10-CM | POA: Diagnosis not present

## 2018-10-15 ENCOUNTER — Ambulatory Visit: Payer: Self-pay | Admitting: Obstetrics & Gynecology

## 2018-10-21 DIAGNOSIS — E559 Vitamin D deficiency, unspecified: Secondary | ICD-10-CM | POA: Diagnosis not present

## 2018-10-21 DIAGNOSIS — E039 Hypothyroidism, unspecified: Secondary | ICD-10-CM | POA: Diagnosis not present

## 2018-10-21 DIAGNOSIS — E7800 Pure hypercholesterolemia, unspecified: Secondary | ICD-10-CM | POA: Diagnosis not present

## 2018-11-16 DIAGNOSIS — M8589 Other specified disorders of bone density and structure, multiple sites: Secondary | ICD-10-CM | POA: Diagnosis not present

## 2018-12-06 ENCOUNTER — Other Ambulatory Visit: Payer: Self-pay

## 2018-12-06 ENCOUNTER — Encounter: Payer: Self-pay | Admitting: Obstetrics & Gynecology

## 2018-12-06 ENCOUNTER — Other Ambulatory Visit (HOSPITAL_COMMUNITY)
Admission: RE | Admit: 2018-12-06 | Discharge: 2018-12-06 | Disposition: A | Discharge status: Home / Self Care | Payer: Medicare Other | Source: Ambulatory Visit | Attending: Obstetrics & Gynecology | Admitting: Obstetrics & Gynecology

## 2018-12-06 ENCOUNTER — Ambulatory Visit (INDEPENDENT_AMBULATORY_CARE_PROVIDER_SITE_OTHER): Payer: Medicare Other | Admitting: Obstetrics & Gynecology

## 2018-12-06 VITALS — BP 120/60 | HR 74 | Resp 14 | Ht 62.0 in | Wt 139.0 lb

## 2018-12-06 DIAGNOSIS — Z124 Encounter for screening for malignant neoplasm of cervix: Secondary | ICD-10-CM | POA: Diagnosis not present

## 2018-12-06 DIAGNOSIS — Z01419 Encounter for gynecological examination (general) (routine) without abnormal findings: Secondary | ICD-10-CM | POA: Diagnosis not present

## 2018-12-06 MED ORDER — LEVOTHYROXINE SODIUM 75 MCG PO TABS: 75.0000 ug | ORAL_TABLET | Freq: Every day | ORAL | Status: AC

## 2018-12-06 NOTE — Progress Notes (Signed)
73 y.o. [id number] Married White or Caucasian female here for annual exam.  Was hospitalized in December, 2018, with URI .  Was treated with antibiotics and fluids.  This really helped.  Then had infection around dental implant.  She was treated with antibiotics and the issues resolved.  She does clinch her teeth.  She is going tomorrow to get a mouth guard.    Denies vaginal bleeding.  She did have a flu shot this year.    Reports her thyroid levels and medication have been fluctuating.  Currently, she is on 78mcg synthroid.  Has follow up scheduled for repeat blood work.    Patient's last menstrual period was 11/03/2000 (approximate).          Sexually active: No.  The current method of family planning is post menopausal status.    Exercising: No.  The patient does not participate in regular exercise at present. Smoker:  Former smoker   Health Maintenance: Pap:  05/21/16 Neg  History of abnormal Pap:  no MMG:  09/06/18 BIRADS1:neg  Colonoscopy:  11/26/16 Hyperplastic polyps.  Dr. Collene Mares.  Follow up five year.    BMD:   2019 in Dr. Julianne Rice office TDaP:  2012 Pneumonia vaccine(s):  2014 Shingrix:  Zostavax Hep C testing: 11/20/15 Neg Screening Labs: PCP.  Reports her    reports that she quit smoking about 52 years ago. She has a 7.00 pack-year smoking history. She has never used smokeless tobacco. She reports that she does not drink alcohol or use drugs.  Past Medical History:  Diagnosis Date  . Carpal tunnel syndrome on both sides   . Degenerative cervical disc   . Diverticulosis 11/2003  . Duodenal diverticulum   . Esophageal stricture   . External hemorrhoids   . Fatty tumor   . GERD (gastroesophageal reflux disease)   . Helicobacter pylori gastritis   . Hiatal hernia   . Hyperlipemia   . Hypothyroid 2  . IBS (irritable bowel syndrome)   . Renal cyst     Past Surgical History:  Procedure Laterality Date  . CARPAL TUNNEL RELEASE Bilateral   . DILATION AND CURETTAGE OF UTERUS     . fatty tumor removal of right arm Right   . LAPAROSCOPY  1981  . NOSE SURGERY     Deviated Septum     Current Outpatient Medications  Medication Sig Dispense Refill  . acetaminophen (TYLENOL) 500 MG tablet Take 500 mg by mouth every 6 (six) hours as needed for mild pain or fever.    . chlorhexidine (PERIDEX) 0.12 % solution as needed.     . Coenzyme Q10 (CO Q 10) 10 MG CAPS Take by mouth.    . diclofenac sodium (VOLTAREN) 1 % GEL APP 4 GRAMS EXT AA QID    . hyoscyamine (LEVSIN SL) 0.125 MG SL tablet Take one tablet sl every 4-6 hours as needed for Abdominal pain and cramping 30 tablet 1  . levothyroxine (SYNTHROID, LEVOTHROID) 75 MCG tablet Take 1 tablet (75 mcg total) by mouth daily before breakfast.    . Omega-3 Fatty Acids (FISH OIL PO) Take by mouth daily.    . pantoprazole (PROTONIX) 40 MG tablet Take 1 tablet once daily  6  . traZODone (DESYREL) 50 MG tablet as needed.      No current facility-administered medications for this visit.     Family History  Problem Relation Age of Onset  . Heart disease Brother   . Lung cancer Father   .  Osteoporosis Mother   . Colon polyps Brother   . Colon cancer Other        grandmother???  . COPD Other   . Osteoporosis Sister   . CVA Maternal Grandmother   . CVA Maternal Grandfather   . CVA Paternal Grandmother   . Heart attack Paternal Grandfather 21    Review of Systems  Gastrointestinal: Positive for abdominal distention.  Musculoskeletal: Positive for arthralgias and myalgias.       Muscle weakness   Skin:       Hair loss  All other systems reviewed and are negative.   Exam:   BP 120/60 (BP Location: Right Arm, Patient Position: Sitting, Cuff Size: Normal)   Pulse 74   Resp 14   Ht 5\' 2"  (1.575 m)   Wt 139 lb (63 kg)   LMP 11/03/2000 (Approximate)   BMI 25.42 kg/m    Height: 5\' 2"  (157.5 cm)  Ht Readings from Last 3 Encounters:  12/06/18 5\' 2"  (1.575 m)  03/21/18 5\' 2"  (1.575 m)  02/21/18 5\' 2"  (1.575 m)     General appearance: alert, cooperative and appears stated age Head: Normocephalic, without obvious abnormality, atraumatic Neck: no adenopathy, supple, symmetrical, trachea midline and thyroid normal to inspection and palpation Lungs: clear to auscultation bilaterally Breasts: normal appearance, no masses or tenderness Heart: regular rate and rhythm Abdomen: soft, non-tender; bowel sounds normal; no masses,  no organomegaly Extremities: extremities normal, atraumatic, no cyanosis or edema Skin: Skin color, texture, turgor normal. No rashes or lesions Lymph nodes: Cervical, supraclavicular, and axillary nodes normal. No abnormal inguinal nodes palpated Neurologic: Grossly normal   Pelvic: External genitalia:  no lesions              Urethra:  normal appearing urethra with no masses, tenderness or lesions              Bartholins and Skenes: normal                 Vagina: normal appearing vagina with normal color and discharge, no lesions              Cervix: no lesions              Pap taken: Yes.   Bimanual Exam:  Uterus:  normal size, contour, position, consistency, mobility, non-tender              Adnexa: normal adnexa and no mass, fullness, tenderness               Rectovaginal: Confirms               Anus:  normal sphincter tone, no lesions  Chaperone was present for exam.  A:  Well Woman with normal exam PMP, no HRT Renal cysts H/o mixed urinary incontinence H/o diverticulosis Esophageal stricture due to GERD Family hx of CVD  P:   Mammogram guidelines reviewed.  Having yearly MMGs pap smear obtained Lab work is done with Dr. Julianne Rice office, seeing NP right now D/w pt shingrix vaccination.  She is not sure she wants to get this.   Colonoscopy UTD.   return annually or prn

## 2018-12-07 LAB — CYTOLOGY - PAP: Diagnosis: NEGATIVE

## 2019-01-03 DIAGNOSIS — H04123 Dry eye syndrome of bilateral lacrimal glands: Secondary | ICD-10-CM | POA: Diagnosis not present

## 2019-01-03 DIAGNOSIS — H00025 Hordeolum internum left lower eyelid: Secondary | ICD-10-CM | POA: Diagnosis not present

## 2019-02-25 ENCOUNTER — Encounter

## 2019-02-25 ENCOUNTER — Ambulatory Visit: Payer: Self-pay | Admitting: Obstetrics & Gynecology

## 2019-06-09 DIAGNOSIS — H04123 Dry eye syndrome of bilateral lacrimal glands: Secondary | ICD-10-CM | POA: Diagnosis not present

## 2019-06-09 DIAGNOSIS — H43812 Vitreous degeneration, left eye: Secondary | ICD-10-CM | POA: Diagnosis not present

## 2019-06-09 DIAGNOSIS — H02834 Dermatochalasis of left upper eyelid: Secondary | ICD-10-CM | POA: Diagnosis not present

## 2019-06-09 DIAGNOSIS — H02831 Dermatochalasis of right upper eyelid: Secondary | ICD-10-CM | POA: Diagnosis not present

## 2019-07-21 DIAGNOSIS — K5730 Diverticulosis of large intestine without perforation or abscess without bleeding: Secondary | ICD-10-CM | POA: Diagnosis not present

## 2019-07-21 DIAGNOSIS — R140 Abdominal distension (gaseous): Secondary | ICD-10-CM | POA: Diagnosis not present

## 2019-07-21 DIAGNOSIS — K5900 Constipation, unspecified: Secondary | ICD-10-CM | POA: Diagnosis not present

## 2019-07-21 DIAGNOSIS — K219 Gastro-esophageal reflux disease without esophagitis: Secondary | ICD-10-CM | POA: Diagnosis not present

## 2019-08-01 DIAGNOSIS — H02831 Dermatochalasis of right upper eyelid: Secondary | ICD-10-CM | POA: Diagnosis not present

## 2019-08-01 DIAGNOSIS — Z961 Presence of intraocular lens: Secondary | ICD-10-CM | POA: Diagnosis not present

## 2019-08-01 DIAGNOSIS — H04123 Dry eye syndrome of bilateral lacrimal glands: Secondary | ICD-10-CM | POA: Diagnosis not present

## 2019-08-01 DIAGNOSIS — H02822 Cysts of right lower eyelid: Secondary | ICD-10-CM | POA: Diagnosis not present

## 2019-08-03 ENCOUNTER — Other Ambulatory Visit: Payer: Self-pay | Admitting: Obstetrics & Gynecology

## 2019-08-03 DIAGNOSIS — Z1231 Encounter for screening mammogram for malignant neoplasm of breast: Secondary | ICD-10-CM

## 2019-08-22 DIAGNOSIS — Z0000 Encounter for general adult medical examination without abnormal findings: Secondary | ICD-10-CM | POA: Diagnosis not present

## 2019-08-22 DIAGNOSIS — E559 Vitamin D deficiency, unspecified: Secondary | ICD-10-CM | POA: Diagnosis not present

## 2019-08-22 DIAGNOSIS — E039 Hypothyroidism, unspecified: Secondary | ICD-10-CM | POA: Diagnosis not present

## 2019-08-22 DIAGNOSIS — E7800 Pure hypercholesterolemia, unspecified: Secondary | ICD-10-CM | POA: Diagnosis not present

## 2019-08-25 DIAGNOSIS — E7800 Pure hypercholesterolemia, unspecified: Secondary | ICD-10-CM | POA: Diagnosis not present

## 2019-08-25 DIAGNOSIS — K219 Gastro-esophageal reflux disease without esophagitis: Secondary | ICD-10-CM | POA: Diagnosis not present

## 2019-08-25 DIAGNOSIS — E039 Hypothyroidism, unspecified: Secondary | ICD-10-CM | POA: Diagnosis not present

## 2019-08-25 DIAGNOSIS — Z0000 Encounter for general adult medical examination without abnormal findings: Secondary | ICD-10-CM | POA: Diagnosis not present

## 2019-08-30 ENCOUNTER — Encounter: Payer: Self-pay | Admitting: Cardiovascular Disease

## 2019-08-30 ENCOUNTER — Ambulatory Visit: Payer: Medicare Other | Admitting: Cardiovascular Disease

## 2019-08-30 ENCOUNTER — Other Ambulatory Visit: Payer: Self-pay

## 2019-08-30 VITALS — BP 134/70 | HR 87 | Temp 97.2°F | Ht 62.0 in | Wt 143.6 lb

## 2019-08-30 DIAGNOSIS — R0789 Other chest pain: Secondary | ICD-10-CM | POA: Diagnosis not present

## 2019-08-30 DIAGNOSIS — K219 Gastro-esophageal reflux disease without esophagitis: Secondary | ICD-10-CM | POA: Diagnosis not present

## 2019-08-30 DIAGNOSIS — E039 Hypothyroidism, unspecified: Secondary | ICD-10-CM

## 2019-08-30 DIAGNOSIS — E7800 Pure hypercholesterolemia, unspecified: Secondary | ICD-10-CM

## 2019-08-30 MED ORDER — PRAVASTATIN SODIUM 20 MG PO TABS: 20.0000 mg | ORAL_TABLET | Freq: Every evening | ORAL | 3 refills | Status: DC

## 2019-08-30 NOTE — Patient Instructions (Signed)
Follow-Up: IN 6 months Please call our office 2 months in advance, JAN 2021 to schedule this APR 2021 appointment. In Person You may see Shelva Majestic, MD or one of the following Advanced Practice Providers on your designated Care Team:  Almyra Deforest, PA-C  Fabian Sharp, PA-C or  [place], [place].    Medication Instructions:  The current medical regimen is effective;  continue present plan and medications as directed. Please refer to the Current Medication list given to you today. If you need a refill on your cardiac medications before your next appointment, please call your pharmacy.  At [facility], you and your health needs are our priority.  As part of our continuing mission to provide you with exceptional heart care, we have created designated Provider Care Teams.  These Care Teams include your primary Cardiologist (physician) and Advanced Practice Providers (APPs -  Physician Assistants and Nurse Practitioners) who all work together to provide you with the care you need, when you need it.  Thank you for choosing [facility] at [facility]!!

## 2019-08-30 NOTE — Progress Notes (Signed)
Patient ID: MAEOLA MCHANEY, female   DOB: [birth year], 73 y.o.   MRN: [id number]      Primary M.D.: Dr. Gareth Eagle  Reestablishment of cardiology care  HPI: MARKAN CAZAREZ is a 73 y.o. female who presents to the office today for a cardiology  evaluation.  I last saw her in June 2017.  Ms. Bogdanski has a history of hypothyroidism and also history of hyperlipidemia.   I had initially seen her over 8 years ago for atypical chest pain.  This seemed more musculoskeletal in etiology.  At that time, she reported having had a routine treadmill test over 5 years prior to that evaluation.  She was scheduled for subsequent exercise Myoview scan.  However, she never had this done.  I saw her 3-1/2 years later in September 2015 and she was remaining stable she has remained fairly stable and has noted rare episodes of sharp, left-sided chest discomfort. She denies any clearcut exertional precipitation.  She denies any exertional dyspnea.  Family history is notable in that her brother died suddenly at age 54 from presumed heart disease, although this was never fully evaluated.  I last saw her in June 2017 at which time she was experiencing sharp twinges of vague chest pain which were not exertionally precipitated and which were lasting seconds.  She has a longstanding history of hyperlipidemia and prior to that evaluation Dr. Wilson Singer had increased her Crestor.  She was having some intermittent muscle aches and I suggested the addition of coenzyme Q 10.  She denied any presyncope or syncope.  Since I last saw her, she now sees Dr. Maudie Mercury for her primary care.  She continues to have problems with degenerative joint disease.  She continues to have issues with nonexertional atypical sharp twinges of chest pain.  He has had issues with hyperlipidemia with cholesterols as high as 240 and LDL at 147 in 2019.  3 weeks ago while in bed she experienced the pain in the upper abdomen under her breast which she felt was very  severe.  She took Tums with some benefit.  She denies any chest pain with activity.  She admits to episodes of bloating indigestion and GERD.  She was no longer taking rosuvastatin.  Apparently 3 weeks previously she was started on pravastatin 20 mg 3 times per week by Thedora Hinders, NP.  She is now referred back for to reestablish cardiology care with me and further evaluation.  Past Medical History:  Diagnosis Date   Carpal tunnel syndrome on both sides    Degenerative cervical disc    Diverticulosis 11/2003   Duodenal diverticulum    Esophageal stricture    External hemorrhoids    Fatty tumor    GERD (gastroesophageal reflux disease)    Helicobacter pylori gastritis    Hiatal hernia    Hyperlipemia    Hypothyroid 49   IBS (irritable bowel syndrome)    Renal cyst     Past Surgical History:  Procedure Laterality Date   CARPAL TUNNEL RELEASE Bilateral    DILATION AND CURETTAGE OF UTERUS     fatty tumor removal of right arm Right    LAPAROSCOPY  1981   NOSE SURGERY     Deviated Septum     Allergies  Allergen Reactions   Codeine Nausea Only    Current Outpatient Medications  Medication Sig Dispense Refill   acetaminophen (TYLENOL) 500 MG tablet Take 500 mg by mouth every 6 (six) hours as needed for mild pain  or fever.     chlorhexidine (PERIDEX) 0.12 % solution as needed.      Coenzyme Q10 (CO Q 10) 10 MG CAPS Take by mouth.     diclofenac sodium (VOLTAREN) 1 % GEL APP 4 GRAMS EXT AA QID     hyoscyamine (LEVSIN SL) 0.125 MG SL tablet Take one tablet sl every 4-6 hours as needed for Abdominal pain and cramping 30 tablet 1   levothyroxine (SYNTHROID, LEVOTHROID) 75 MCG tablet Take 1 tablet (75 mcg total) by mouth daily before breakfast.     traZODone (DESYREL) 50 MG tablet as needed.      pravastatin (PRAVACHOL) 20 MG tablet Take 1 tablet (20 mg total) by mouth every evening. 90 tablet 3   No current facility-administered medications for this visit.      Social History   Socioeconomic History   Marital status: Married    Spouse name: Not on file   Number of children: Not on file   Years of education: Not on file   Highest education level: Not on file  Occupational History   Not on file  Social Needs   Financial resource strain: Not on file   Food insecurity    Worry: Not on file    Inability: Not on file   Transportation needs    Medical: Not on file    Non-medical: Not on file  Tobacco Use   Smoking status: Former Smoker    Packs/day: 1.00    Years: 7.00    Pack years: 7.00    Quit date: 11/03/1966    Years since quitting: 52.8   Smokeless tobacco: Never Used  Substance and Sexual Activity   Alcohol use: No   Drug use: No   Sexual activity: Never  Lifestyle   Physical activity    Days per week: Not on file    Minutes per session: Not on file   Stress: Not on file  Relationships   Social connections    Talks on phone: Not on file    Gets together: Not on file    Attends religious service: Not on file    Active member of club or organization: Not on file    Attends meetings of clubs or organizations: Not on file    Relationship status: Not on file   Intimate partner violence    Fear of current or ex partner: Not on file    Emotionally abused: Not on file    Physically abused: Not on file    Forced sexual activity: Not on file  Other Topics Concern   Not on file  Social History Narrative   Not on file   Social history is notable in that she completed 11th grade of education.  She has 2 children and 4 grandchildren.  There is no EtOH use.  Family History  Problem Relation Age of Onset   Heart disease Brother    Lung cancer Father    Osteoporosis Mother    Colon polyps Brother    Colon cancer Other        grandmother???   COPD Other    Osteoporosis Sister    CVA Maternal Grandmother    CVA Maternal Grandfather    CVA Paternal Grandmother    Heart attack Paternal  Grandfather 70    ROS General: Negative; No fevers, chills, or night sweats HEENT: Negative; No changes in vision or hearing, sinus congestion, difficulty swallowing Pulmonary: Negative; No cough, wheezing, shortness of breath, hemoptysis Cardiovascular: See  HPI:  GI: Positive for abdominal bloating, indigestion, GERD and constipation GU: Negative; No dysuria, hematuria, or difficulty voiding Musculoskeletal: Positive for degenerative joint disease Hematologic: Negative; no easy bruising, bleeding Endocrine: Positive for hypothyroidism; no diabetes, history of vitamin D insufficiency Neuro: Negative; no changes in balance, headaches Skin: Negative; No rashes or skin lesions Psychiatric: Negative; No behavioral problems, depression Sleep: Negative; No snoring,  daytime sleepiness, hypersomnolence, bruxism, restless legs, hypnogognic hallucinations. Other comprehensive 14 point system review is negative   Physical Exam BP 134/70    Pulse 87    Temp (!) 97.2 F (36.2 C)    Ht '5\' 2"'  (1.575 m)    Wt 143 lb 9.6 oz (65.1 kg)    LMP 11/03/2000 (Approximate)    SpO2 97%    BMI 26.26 kg/m    Repeat blood pressure by me was 98/60 supine and 94/60 standing  Wt Readings from Last 3 Encounters:  08/30/19 143 lb 9.6 oz (65.1 kg)  12/06/18 139 lb (63 kg)  03/21/18 125 lb (56.7 kg)   General: Alert, oriented, no distress.  Skin: normal turgor, no rashes, warm and dry HEENT: Normocephalic, atraumatic. Pupils equal round and reactive to light; sclera anicteric; extraocular muscles intact;  Nose without nasal septal hypertrophy Mouth/Parynx benign; Mallinpatti scale 2 Neck: No JVD, no carotid bruits; normal carotid upstroke Lungs: clear to ausculatation and percussion; no wheezing or rales Chest wall: without tenderness to palpitation Heart: PMI not displaced, RRR, s1 s2 normal, 1/6 systolic murmur, no diastolic murmur, no rubs, gallops, thrills, or heaves Abdomen: soft, nontender; no  hepatosplenomehaly, BS+; abdominal aorta nontender and not dilated by palpation. Back: no CVA tenderness Pulses 2+ Musculoskeletal: full range of motion, normal strength, no joint deformities Extremities: no clubbing cyanosis or edema, Homan's sign negative  Neurologic: grossly nonfocal; Cranial nerves grossly wnl Psychologic: Normal mood and affect   ECG (independently read by me): Normal sinus rhythm at 82 bpm.  No ectopy.  QTc interval 479 ms  June 2017 ECG (independently read by me): Sinus bradycardia 59 bpm; normal intervals  September 2015 ECG (independently read by me): Normal sinus rhythm at 78 beats per minute.  No ectopy.  QTc interval slightly increased at 460 ms.  LABS:  BMP Latest Ref Rng & Units 10/26/2017 10/25/2017 01/17/2015  Glucose 65 - 99 mg/dL 102(H) 175(H) 107(H)  BUN 6 - 20 mg/dL '8 12 22  ' Creatinine 0.44 - 1.00 mg/dL 0.60 0.67 0.84  Sodium 135 - 145 mmol/L 140 137 137  Potassium 3.5 - 5.1 mmol/L 3.3(L) 3.4(L) 3.7  Chloride 101 - 111 mmol/L 114(H) 108 104  CO2 22 - 32 mmol/L 22 19(L) 29  Calcium 8.9 - 10.3 mg/dL 8.0(L) 8.6(L) 9.0    Hepatic Function Latest Ref Rng & Units 10/25/2017 08/07/2013 10/08/2012  Total Protein 6.5 - 8.1 g/dL 6.1(L) 6.6 6.6  Albumin 3.5 - 5.0 g/dL 3.8 4.2 3.9  AST 15 - 41 U/L 35 24 23  ALT 14 - 54 U/L '24 21 22  ' Alk Phosphatase 38 - 126 U/L 79 84 72  Total Bilirubin 0.3 - 1.2 mg/dL 0.4 0.6 0.8   CBC Latest Ref Rng & Units 10/26/2017 10/25/2017 08/07/2013  WBC 4.0 - 10.5 K/uL 10.7(H) 13.9(H) 5.1  Hemoglobin 12.0 - 15.0 g/dL 11.8(L) 13.3 14.9  Hematocrit 36.0 - 46.0 % 36.9 39.5 44.3  Platelets 150 - 400 K/uL 177 222 216   Lab Results  Component Value Date   MCV 90.0 10/26/2017   MCV 89.8 10/25/2017  MCV 89.7 08/07/2013   No results found for: TSH   Lipid Panel  No results found for: CHOL, TRIG, HDL, CHOLHDL, VLDL, LDLCALC, LDLDIRECT   RADIOLOGY: No results found.  IMPRESSION: 1. Atypical chest pain   2. Pure  hypercholesterolemia   3. Hypothyroidism, unspecified type   4. Gastroesophageal reflux disease without esophagitis     ASSESSMENT AND PLAN: Ms. Naz Denunzio is a 83 -year-old female who has a longstanding history of hypothyroidism almost 25 years, and a history of hyperlipidemia for at least 15 years.  Over the years she has experienced recurrent episodes of  sharp twinges of chest pain that are not exertionally precipitated and short-lived.  Recently she has experienced pain in her upper abdomen under her breast which occurred while she was in bed.  She described it as being exceedingly severe.  She took Tums with improvement.  She has had issues with abdominal bloating, indigestion, GERD as well as constipation.  She has specifically denied exertional precipitation of chest pain.  There is no significant exertional dyspnea but she does not routinely exercise.  Her ECG today remained stable and shows normal sinus rhythm without ectopy or ST-T changes.  QTc interval was slightly prolonged.  Has a family history of premature heart disease.  She has had significant cholesterol elevation and last laboratory in December 2019 showed a cholesterol of 239 LDL cholesterol 147.  Remotely she had taking rosuvastatin but discontinued this.  Recent laboratory from August 22, 2019 showed stable CBC.  BUN was 9 creatinine 0.77.  Total cholesterol is now 240, LDL cholesterol 157.  She had normal triglycerides at 87 and HDL was excellent at 68.  She was started on a pravastatin 20 mg and has been taking this 3 times per week following this recent laboratory and is  scheduled to have follow-up laboratory in 2 months by Lilyan Gilford, NP.  If her LDL is still elevated I would add Zetia 10 mg to her medical regimen.  Her blood pressure today is on the low side and she is not having orthostatic symptoms.  She is not on any blood pressure medication.  She has no history of presyncope or syncope.  I discussed her atypical chest  pain.  I suspect she has a significant component of reflux.  I have recommended pantoprazole 40 mg daily.  She is on levothyroxine 75 mg daily for hypothyroidism.  I discussed trying to initiate an exercise program with at least walking 5 days/week hopefully for 30 minutes increase intensity as she tolerates.  Presently, she is sleeping adequately.  I calculated an Epworth Sleepiness Scale in the office today which endorsed at 2 arguing against excessive daytime sleepiness.   Time spent: 25 minutes  Troy Sine, MD, [facility]  09/05/2019 1:57 PM

## 2019-09-05 ENCOUNTER — Encounter: Payer: Self-pay | Admitting: Cardiovascular Disease

## 2019-09-08 DIAGNOSIS — L814 Other melanin hyperpigmentation: Secondary | ICD-10-CM | POA: Diagnosis not present

## 2019-09-08 DIAGNOSIS — D225 Melanocytic nevi of trunk: Secondary | ICD-10-CM | POA: Diagnosis not present

## 2019-09-08 DIAGNOSIS — L821 Other seborrheic keratosis: Secondary | ICD-10-CM | POA: Diagnosis not present

## 2019-09-08 DIAGNOSIS — D2262 Melanocytic nevi of left upper limb, including shoulder: Secondary | ICD-10-CM | POA: Diagnosis not present

## 2019-09-08 DIAGNOSIS — D1801 Hemangioma of skin and subcutaneous tissue: Secondary | ICD-10-CM | POA: Diagnosis not present

## 2019-09-08 DIAGNOSIS — L570 Actinic keratosis: Secondary | ICD-10-CM | POA: Diagnosis not present

## 2019-09-19 ENCOUNTER — Other Ambulatory Visit: Payer: Self-pay

## 2019-09-19 ENCOUNTER — Ambulatory Visit
Admission: RE | Admit: 2019-09-19 | Discharge: 2019-09-19 | Disposition: A | Discharge status: Home / Self Care | Payer: Medicare Other | Source: Ambulatory Visit | Attending: Obstetrics & Gynecology | Admitting: Obstetrics & Gynecology

## 2019-09-19 DIAGNOSIS — Z1231 Encounter for screening mammogram for malignant neoplasm of breast: Secondary | ICD-10-CM | POA: Diagnosis not present

## 2019-09-21 ENCOUNTER — Other Ambulatory Visit: Payer: Self-pay | Admitting: Obstetrics & Gynecology

## 2019-09-21 DIAGNOSIS — R928 Other abnormal and inconclusive findings on diagnostic imaging of breast: Secondary | ICD-10-CM

## 2019-09-23 ENCOUNTER — Other Ambulatory Visit: Payer: Self-pay

## 2019-09-23 ENCOUNTER — Ambulatory Visit
Admission: RE | Admit: 2019-09-23 | Discharge: 2019-09-23 | Disposition: A | Discharge status: Home / Self Care | Payer: Medicare Other | Source: Ambulatory Visit | Attending: Obstetrics & Gynecology | Admitting: Obstetrics & Gynecology

## 2019-09-23 DIAGNOSIS — R928 Other abnormal and inconclusive findings on diagnostic imaging of breast: Secondary | ICD-10-CM

## 2019-09-23 DIAGNOSIS — N6313 Unspecified lump in the right breast, lower outer quadrant: Secondary | ICD-10-CM | POA: Diagnosis not present

## 2019-10-18 DIAGNOSIS — K219 Gastro-esophageal reflux disease without esophagitis: Secondary | ICD-10-CM | POA: Diagnosis not present

## 2019-11-07 DIAGNOSIS — M5416 Radiculopathy, lumbar region: Secondary | ICD-10-CM | POA: Diagnosis not present

## 2019-11-07 DIAGNOSIS — M5136 Other intervertebral disc degeneration, lumbar region: Secondary | ICD-10-CM | POA: Diagnosis not present

## 2019-11-17 DIAGNOSIS — M545 Low back pain: Secondary | ICD-10-CM | POA: Diagnosis not present

## 2019-11-25 DIAGNOSIS — M545 Low back pain: Secondary | ICD-10-CM | POA: Diagnosis not present

## 2019-11-30 DIAGNOSIS — M545 Low back pain: Secondary | ICD-10-CM | POA: Diagnosis not present

## 2019-12-02 DIAGNOSIS — M545 Low back pain: Secondary | ICD-10-CM | POA: Diagnosis not present

## 2019-12-08 DIAGNOSIS — M545 Low back pain: Secondary | ICD-10-CM | POA: Diagnosis not present

## 2020-03-28 ENCOUNTER — Ambulatory Visit: Payer: Medicare Other | Admitting: Physician Assistant

## 2020-04-11 ENCOUNTER — Other Ambulatory Visit: Payer: Self-pay

## 2020-04-12 NOTE — Progress Notes (Signed)
74 y.o. [id number] Married White or Caucasian female here for annual exam.  Has been having some lower back issues.  PT didn't really help.  Massages really helped.  Reports pain has completely resolved.    Denies vaginal bleeding.     PCP:  Stanton Kidney, NP, at [facility].  Appt was in 10/202.  Patient's last menstrual period was 11/03/2000 (approximate).          Sexually active: No.  The current method of family planning is post menopausal status.    Exercising: No.  exercise Smoker:  no  Health Maintenance: Pap:  05-21-16 neg, 12-06-2018 neg History of abnormal Pap:  no MMG:  09/2019 bilateral & rt breast u/s birads 2:neg Colonoscopy:  11-26-16 hyperplastic polyps f/u 80yrs BMD:   2019 Dr Maudie Mercury TDaP:  2012 Pneumonia vaccine(s):  2014 Shingrix:   zostavax 2009 Hep C testing: 2017 neg Screening Labs: done in October   reports that she quit smoking about 53 years ago. She has a 7.00 pack-year smoking history. She has never used smokeless tobacco. She reports that she does not drink alcohol and does not use drugs.  Past Medical History:  Diagnosis Date  . Carpal tunnel syndrome on both sides   . Degenerative cervical disc   . Diverticulosis 11/2003  . Duodenal diverticulum   . Esophageal stricture   . External hemorrhoids   . Fatty tumor   . GERD (gastroesophageal reflux disease)   . Helicobacter pylori gastritis   . Hiatal hernia   . Hyperlipemia   . Hypothyroid 27  . IBS (irritable bowel syndrome)   . Renal cyst     Past Surgical History:  Procedure Laterality Date  . CARPAL TUNNEL RELEASE Bilateral   . DILATION AND CURETTAGE OF UTERUS    . fatty tumor removal of right arm Right   . LAPAROSCOPY  1981  . NOSE SURGERY     Deviated Septum     Current Outpatient Medications  Medication Sig Dispense Refill  . acetaminophen (TYLENOL) 500 MG tablet Take 500 mg by mouth every 6 (six) hours as needed for mild pain or fever.    . chlorhexidine (PERIDEX) 0.12 %  solution as needed.     . diclofenac sodium (VOLTAREN) 1 % GEL APP 4 GRAMS EXT AA QID    . hyoscyamine (LEVSIN SL) 0.125 MG SL tablet Take one tablet sl every 4-6 hours as needed for Abdominal pain and cramping 30 tablet 1  . levothyroxine (SYNTHROID, LEVOTHROID) 75 MCG tablet Take 1 tablet (75 mcg total) by mouth daily before breakfast.    . pantoprazole (PROTONIX) 40 MG tablet Take 40 mg by mouth every morning.    . rosuvastatin (CRESTOR) 10 MG tablet Take by mouth.     No current facility-administered medications for this visit.    Family History  Problem Relation Age of Onset  . Heart disease Brother   . Lung cancer Father   . Osteoporosis Mother   . Colon polyps Brother   . Colon cancer Other        grandmother???  . COPD Other   . Osteoporosis Sister   . CVA Maternal Grandmother   . CVA Maternal Grandfather   . CVA Paternal Grandmother   . Heart attack Paternal Grandfather 51    Review of Systems  Constitutional: Negative.   HENT: Negative.   Eyes: Negative.   Respiratory: Negative.   Cardiovascular: Negative.   Gastrointestinal: Positive for constipation.  Rectal bleeding  Endocrine: Negative.   Genitourinary: Negative.   Musculoskeletal: Negative.   Skin: Negative.   Allergic/Immunologic: Negative.   Neurological: Negative.   Hematological: Negative.   Psychiatric/Behavioral: Negative.     Exam:   BP 112/80   Pulse 68   Temp (!) 97.1 F (36.2 C) (Skin)   Resp 16   Ht 5' 1.25" (1.556 m)   Wt 141 lb (64 kg)   LMP 11/03/2000 (Approximate)   BMI 26.42 kg/m   Height: 5' 1.25" (155.6 cm)  General appearance: alert, cooperative and appears stated age Head: Normocephalic, without obvious abnormality, atraumatic Neck: no adenopathy, supple, symmetrical, trachea midline and thyroid normal to inspection and palpation Lungs: clear to auscultation bilaterally Breasts: normal appearance, no masses or tenderness Heart: regular rate and rhythm Abdomen: soft,  non-tender; bowel sounds normal; no masses,  no organomegaly Extremities: extremities normal, atraumatic, no cyanosis or edema Skin: Skin color, texture, turgor normal. No rashes or lesions Lymph nodes: Cervical, supraclavicular, and axillary nodes normal. No abnormal inguinal nodes palpated Neurologic: Grossly normal   Pelvic: External genitalia:  no lesions              Urethra:  normal appearing urethra with no masses, tenderness or lesions              Bartholins and Skenes: normal                 Vagina: normal appearing vagina with normal color and discharge, no lesions              Cervix: no lesions              Pap taken: No. Bimanual Exam:  Uterus:  normal size, contour, position, consistency, mobility, non-tender              Adnexa: normal adnexa and no mass, fullness, tenderness               Rectovaginal: Confirms               Anus:  normal sphincter tone, no lesions  Chaperone, Terence Lux, CMA, was present for exam.  A:  Well Woman with normal exam PMP, no HRT H/o renal cysts H/o mixed urinary incontinence H/o diverticulosis Esophageal stricture due to GERD Family hx of CVD  P:   Mammogram guidelines reviewed.  Having yearly MMGs. pap smear neg 2020.  Not indicated today. Colonoscopy UTD BMD UTD Lab work done in October Return annually or prn

## 2020-04-13 ENCOUNTER — Other Ambulatory Visit: Payer: Self-pay

## 2020-04-13 ENCOUNTER — Encounter: Payer: Self-pay | Admitting: Obstetrics & Gynecology

## 2020-04-13 ENCOUNTER — Ambulatory Visit (INDEPENDENT_AMBULATORY_CARE_PROVIDER_SITE_OTHER): Payer: Medicare Other | Admitting: Obstetrics & Gynecology

## 2020-04-13 VITALS — BP 112/80 | HR 68 | Temp 97.1°F | Resp 16 | Ht 61.25 in | Wt 141.0 lb

## 2020-04-13 DIAGNOSIS — Z01419 Encounter for gynecological examination (general) (routine) without abnormal findings: Secondary | ICD-10-CM | POA: Diagnosis not present

## 2020-04-19 ENCOUNTER — Ambulatory Visit: Payer: Medicare Other | Admitting: Physician Assistant

## 2020-04-30 NOTE — Progress Notes (Signed)
Cardiology Office Note   Date:  05/01/2020   ID:  Victoria Peters, Victoria Peters Sep 18, 1946, MRN [id number]  PCP:  Jani Gravel, MD  Cardiologist:  Dr. Claiborne Billings  CC: Fatigue    History of Present Illness: Victoria Peters is a 74 y.o. female who presents for ongoing assessment and management of atypical chest pain, pure hypercholesterolemia, with other history to include hypothyroidism and GERD.  She was last seen by Dr. Claiborne Billings on 08/30/2019.  He noted that she was having some abdominal pain and heartburn for which she was taking Tums.  Her EKG was normal without any ectopy or evidence of prior MI or ST-T wave changes.  He noted that she had a strong family history of premature coronary artery disease.  He recommended adding Zetia 10 mg to her medication regimen.  She was also recommended for PPI.  She is here for 32-month follow-up.  She comes today with complaints of generalized fatigue and some puffiness in her hands and ankles at times.  She denies chest pain. Some mild DOE but not often. She denies dizziness, near syncope, or intermittent claudication symptoms. She is medically compliant. Just had labs drawn by PCP, Dr.Kim about 3 weeks ago.  Past Medical History:  Diagnosis Date  . Carpal tunnel syndrome on both sides   . Degenerative cervical disc   . Diverticulosis 11/2003  . Duodenal diverticulum   . Esophageal stricture   . External hemorrhoids   . Fatty tumor   . GERD (gastroesophageal reflux disease)   . Helicobacter pylori gastritis   . Hiatal hernia   . Hyperlipemia   . Hypothyroid 29  . IBS (irritable bowel syndrome)   . Renal cyst     Past Surgical History:  Procedure Laterality Date  . CARPAL TUNNEL RELEASE Bilateral   . DILATION AND CURETTAGE OF UTERUS    . fatty tumor removal of right arm Right   . LAPAROSCOPY  1981  . NOSE SURGERY     Deviated Septum      Current Outpatient Medications  Medication Sig Dispense Refill  . acetaminophen (TYLENOL) 500 MG tablet Take 500 mg  by mouth every 6 (six) hours as needed for mild pain or fever.    . chlorhexidine (PERIDEX) 0.12 % solution as needed.     . diclofenac sodium (VOLTAREN) 1 % GEL APP 4 GRAMS EXT AA QID    . hyoscyamine (LEVSIN SL) 0.125 MG SL tablet Take one tablet sl every 4-6 hours as needed for Abdominal pain and cramping 30 tablet 1  . levothyroxine (SYNTHROID, LEVOTHROID) 75 MCG tablet Take 1 tablet (75 mcg total) by mouth daily before breakfast.    . pantoprazole (PROTONIX) 40 MG tablet Take 40 mg by mouth every morning.    . rosuvastatin (CRESTOR) 10 MG tablet Take by mouth.     No current facility-administered medications for this visit.    Allergies:   Codeine    Social History:  The patient  reports that she quit smoking about 53 years ago. She has a 7.00 pack-year smoking history. She has never used smokeless tobacco. She reports that she does not drink alcohol and does not use drugs.   Family History:  The patient's family history includes COPD in an other family member; CVA in her maternal grandfather, maternal grandmother, and paternal grandmother; Colon cancer in an other family member; Colon polyps in her brother; Heart attack (age of onset: 81) in her paternal grandfather; Heart disease in her brother; Lung cancer  in her father; Osteoporosis in her mother and sister.    ROS: All other systems are reviewed and negative. Unless otherwise mentioned in H&P    PHYSICAL EXAM: VS:  BP 108/64   Pulse 82   Ht 5' (1.524 m)   Wt 143 lb 9.6 oz (65.1 kg)   LMP 11/03/2000 (Approximate)   SpO2 97%   BMI 28.04 kg/m  , BMI Body mass index is 28.04 kg/m. GEN: Well nourished, well developed, in no acute distress HEENT: normal Neck: no JVD, carotid bruits, or masses Cardiac: RRR; no murmurs, rubs, or gallops,no edema  Respiratory:  Clear to auscultation bilaterally, normal work of breathing GI: soft, nontender, nondistended, + BS MS: no deformity or atrophy Skin: warm and dry, no rash Neuro:   Strength and sensation are intact Psych: euthymic mood, full affect   EKG: (Personally reviewed)normal sinus rhythm rate of 86 bpm.   Recent Labs: No results found for requested labs within last 8760 hours.    Lipid Panel No results found for: CHOL, TRIG, HDL, CHOLHDL, VLDL, LDLCALC, LDLDIRECT    Wt Readings from Last 3 Encounters:  05/01/20 143 lb 9.6 oz (65.1 kg)  04/13/20 141 lb (64 kg)  08/30/19 143 lb 9.6 oz (65.1 kg)      ASSESSMENT AND PLAN:  1. Generalized fatigue with mild DOE: Will have echo completed to evaluate for changes in LV function causing her symptoms. EKG is normal.   2. LEE: She has mild puffiness in her legs, ankles, and hands on occasion. She does eat out a lot since pandemic restrictions have been lifted. These foods are likely to have a high salt content. I have asked her to watch her salt intake as much as she can.   3. Hypothyroidism: Follow by PCP. Recent labs.   4. Hypercholesterolemia: On rosuvastatin 10 mg daily. Will request and review from PCP.    Current medicines are reviewed at length with the patient today.  I have spent 25 minutes  dedicated to the care of this patient on the date of this encounter to include pre-visit review of records, assessment, management and diagnostic testing,with shared decision making.  Labs/ tests ordered today include: Echocardiogram   Phill Myron. West Pugh, ANP, AACC   05/01/2020 11:10 AM    [place] [address] Office [phone] Fax [phone]  Notice: This dictation was prepared with Dragon dictation along with smaller phrase technology. Any transcriptional errors that result from this process are unintentional and may not be corrected upon review.

## 2020-05-01 ENCOUNTER — Encounter: Payer: Self-pay | Admitting: Adult Health

## 2020-05-01 ENCOUNTER — Other Ambulatory Visit: Payer: Self-pay

## 2020-05-01 ENCOUNTER — Ambulatory Visit: Payer: Medicare Other | Admitting: Adult Health

## 2020-05-01 VITALS — BP 108/64 | HR 82 | Ht 60.0 in | Wt 143.6 lb

## 2020-05-01 DIAGNOSIS — R0600 Dyspnea, unspecified: Secondary | ICD-10-CM

## 2020-05-01 DIAGNOSIS — R0609 Other forms of dyspnea: Secondary | ICD-10-CM

## 2020-05-01 NOTE — Patient Instructions (Signed)
Medication Instructions:  Your physician recommends that you continue on your current medications as directed. Please refer to the Current Medication list given to you today.  *If you need a refill on your cardiac medications before your next appointment, please call your pharmacy*  Lab Work: NONE ordered at this time of appointment   If you have labs (blood work) drawn today and your tests are completely normal, you will receive your results only by: Marland Kitchen MyChart Message (if you have MyChart) OR . A paper copy in the mail If you have any lab test that is abnormal or we need to change your treatment, we will call you to review the results.  Testing/Procedures: Your physician has requested that you have an echocardiogram. Echocardiography is a painless test that uses sound waves to create images of your heart. It provides your doctor with information about the size and shape of your heart and how well your heart's chambers and valves are working. This procedure takes approximately one hour. There are no restrictions for this procedure.   Please schedule for 3-4 weeks   Follow-Up: At [facility], you and your health needs are our priority.  As part of our continuing mission to provide you with exceptional heart care, we have created designated Provider Care Teams.  These Care Teams include your primary Cardiologist (physician) and Advanced Practice Providers (APPs -  Physician Assistants and Nurse Practitioners) who all work together to provide you with the care you need, when you need it.  We recommend signing up for the patient portal called "MyChart".  Sign up information is provided on this After Visit Summary.  MyChart is used to connect with patients for Virtual Visits (Telemedicine).  Patients are able to view lab/test results, encounter notes, upcoming appointments, etc.  Non-urgent messages can be sent to your provider as well.   To learn more about what you can do with MyChart, go to  [email or web address].    Your next appointment:   4-6 week(s)  The format for your next appointment:   In Person  Provider:   Shelva Majestic, MD  Other Instructions

## 2020-05-14 IMAGING — MG DIGITAL SCREENING BILATERAL MAMMOGRAM WITH TOMO AND CAD
8 series · 9 of 24 positions shown · non-contrast
Comparison: Previous exam(s).

CLINICAL DATA: Screening.

EXAM:
DIGITAL SCREENING BILATERAL MAMMOGRAM WITH TOMO AND CAD

[L CC synth-2D]
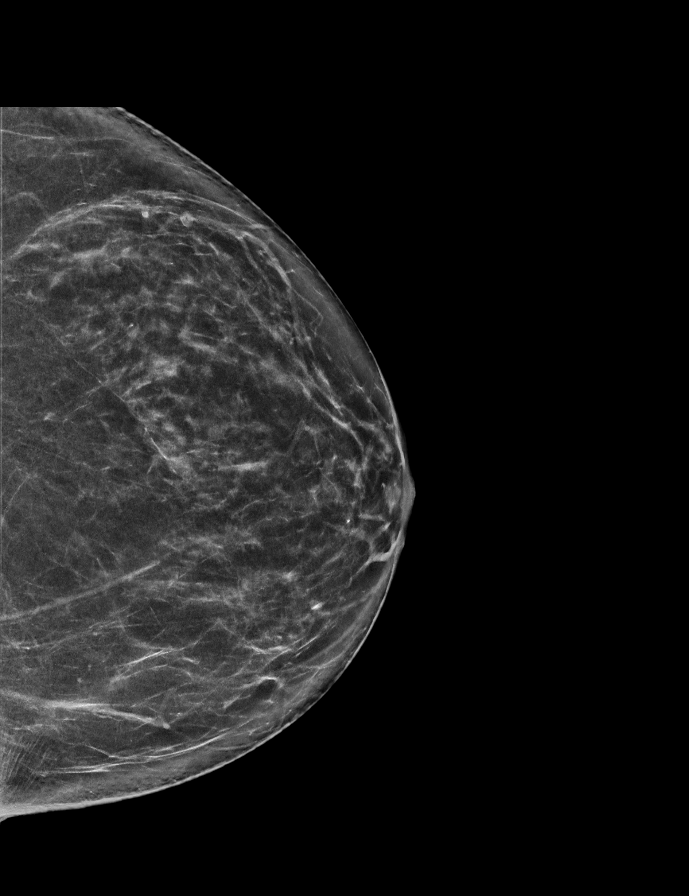

[L MLO synth-2D]
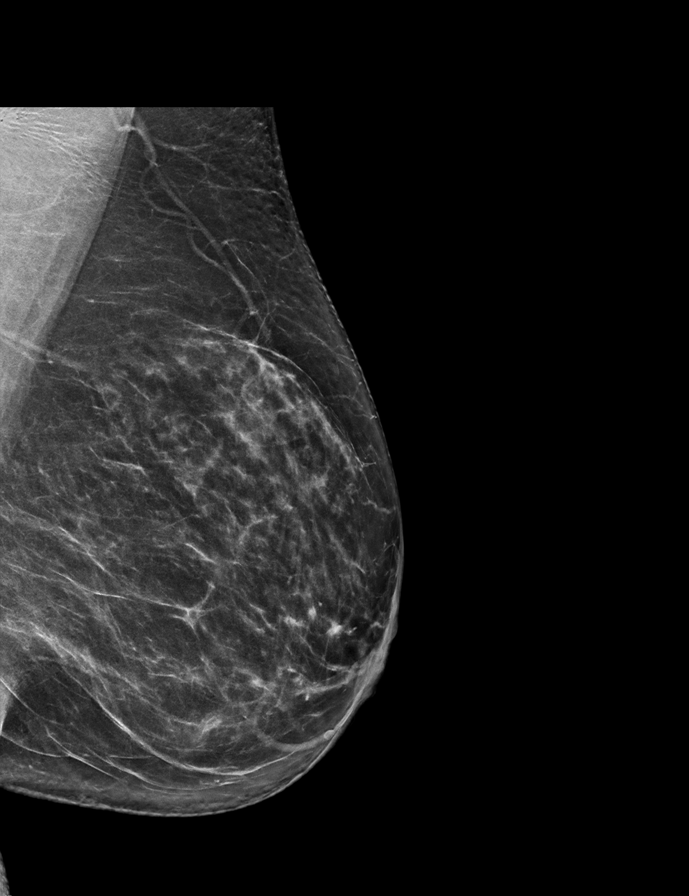

[R MLO synth-2D]
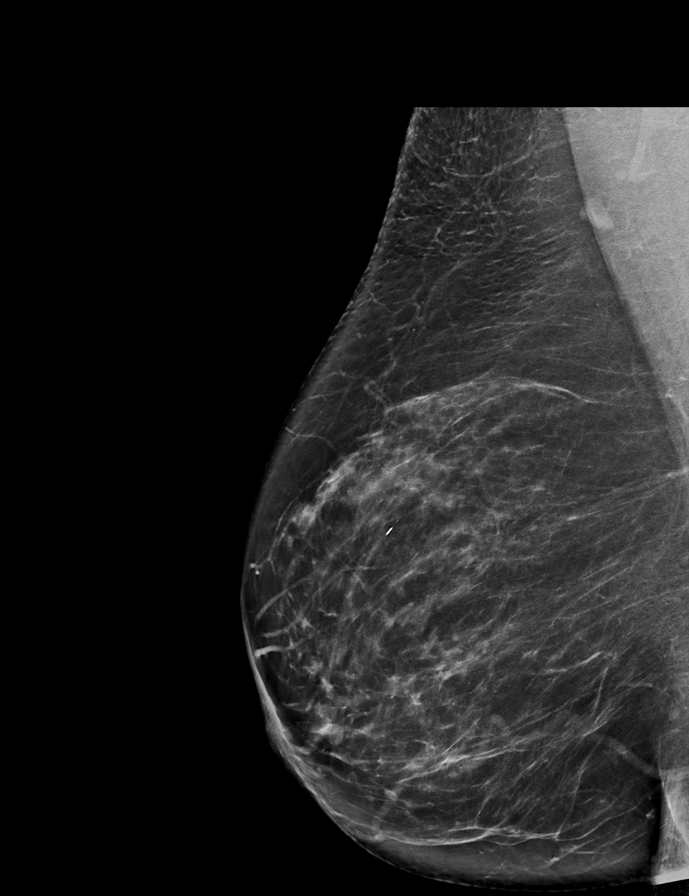

[R CC synth-2D]
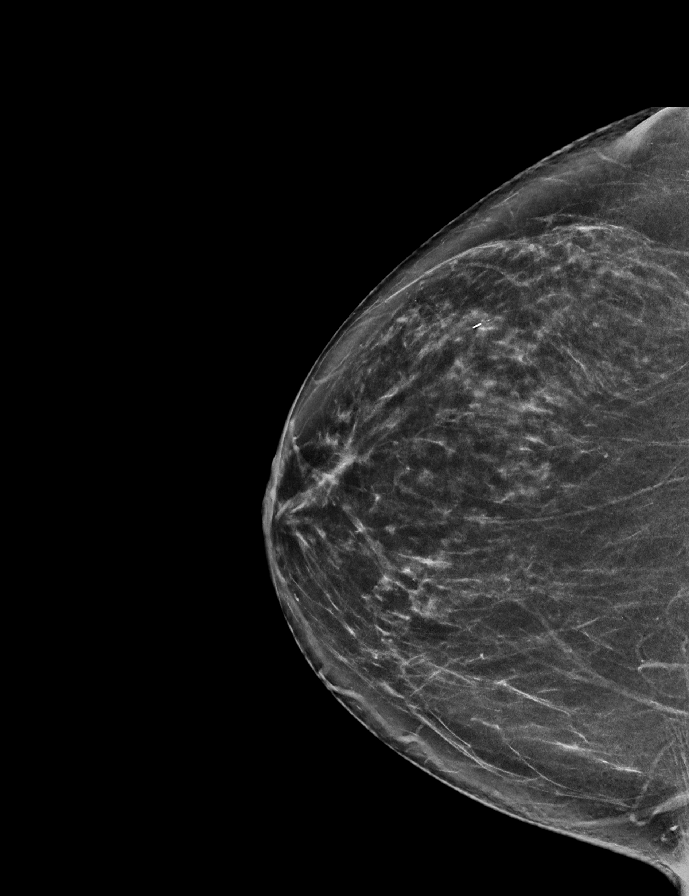

[R CC tomo · 2 of 75 frames shown]
[frame 25/75]
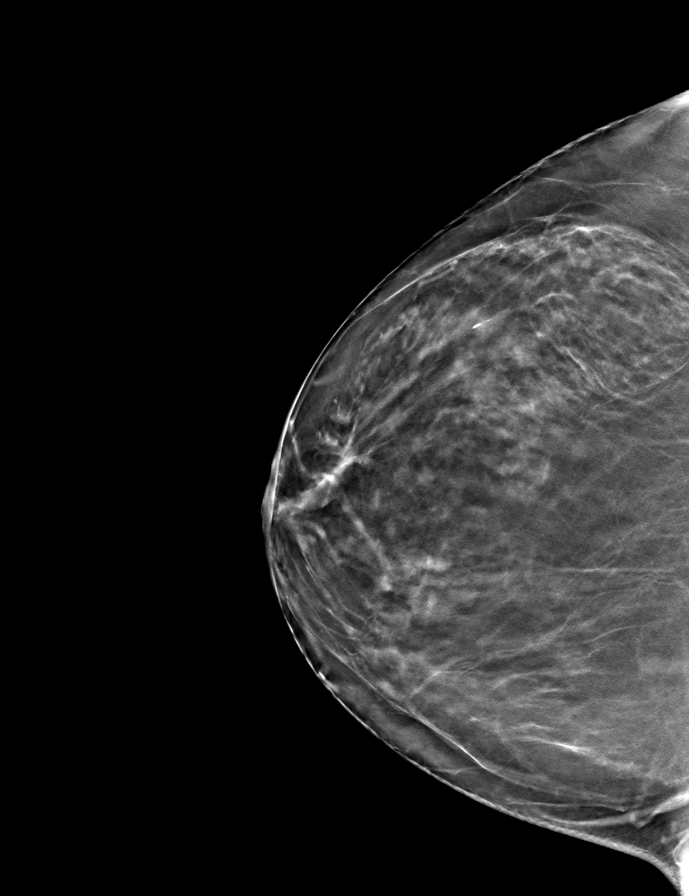
[frame 38/75]
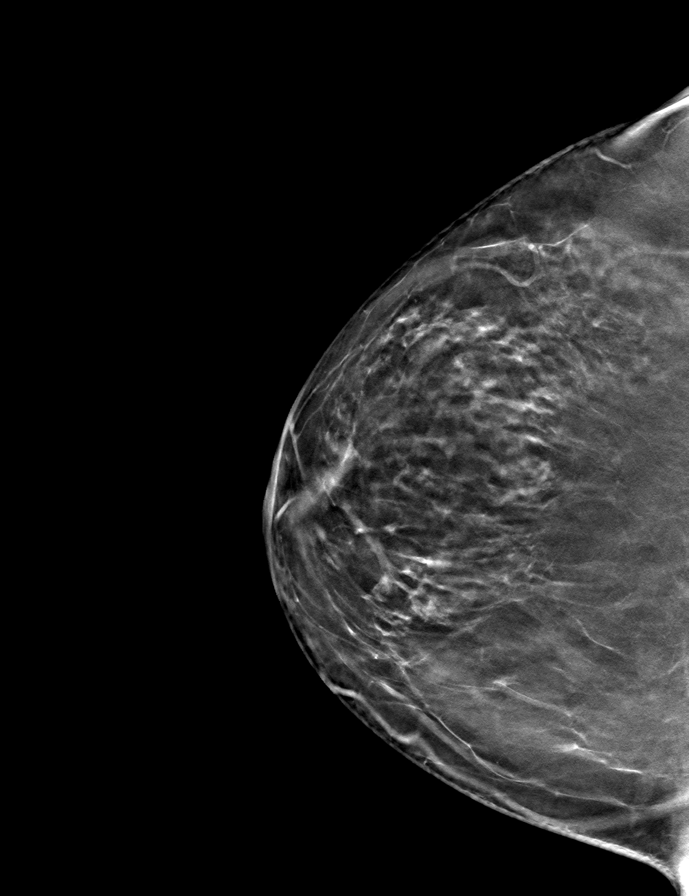

[R MLO tomo · tomo slice 42/83.0]
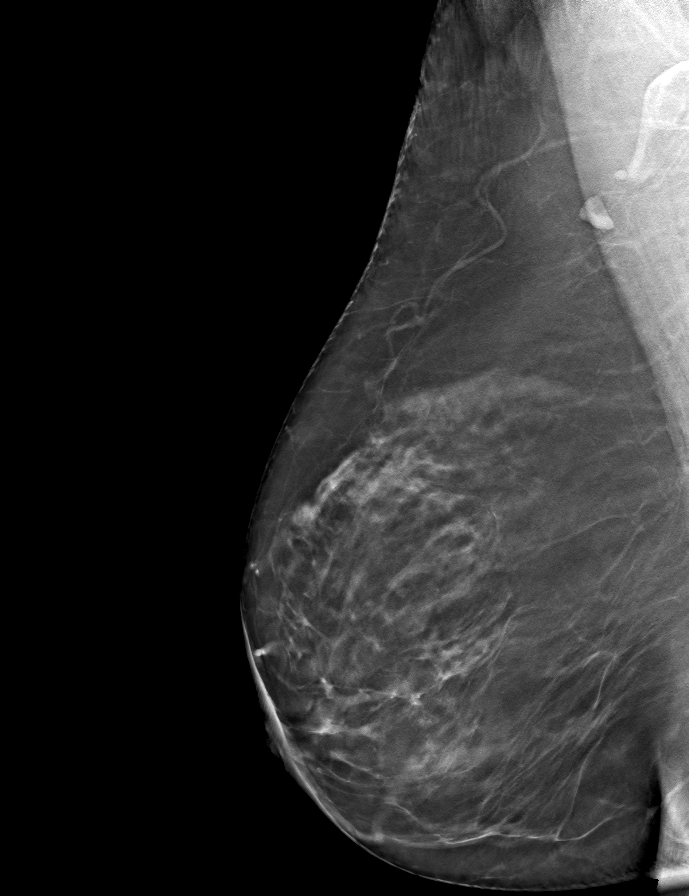

[L MLO tomo · tomo slice 40/79.0]
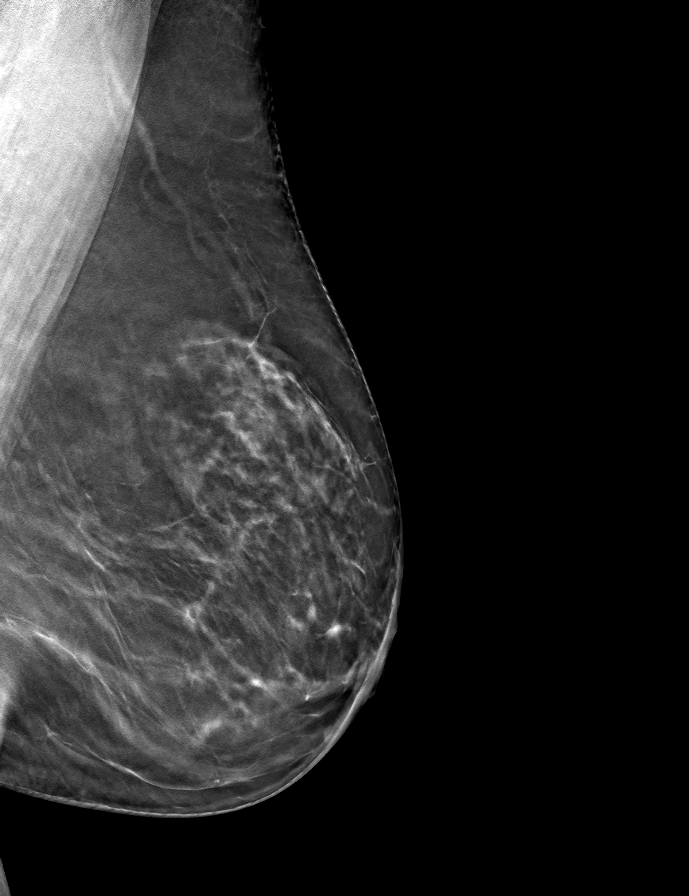

[L CC tomo · tomo slice 36/71.0]
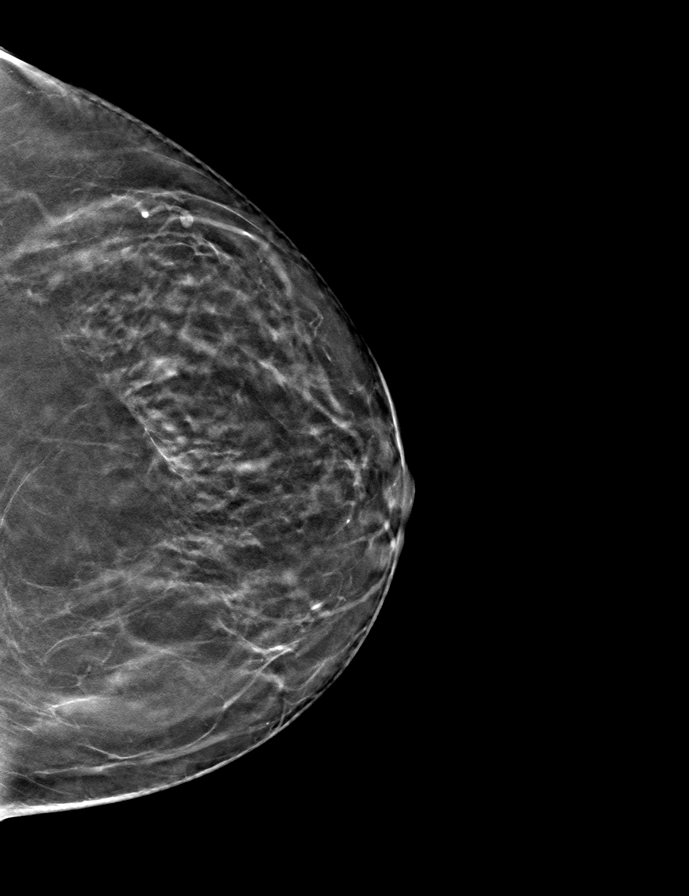

[9 of 24 positions shown; findings below may reference images not displayed]

ACR Breast Density Category b: There are scattered areas of
fibroglandular density.
FINDINGS: There are no findings suspicious for malignancy. Images were
processed with CAD.
IMPRESSION: No mammographic evidence of malignancy. A result letter of this
screening mammogram will be mailed directly to the patient.

RECOMMENDATION:
Screening mammogram in one year. (Code:[id number])

BI-RADS CATEGORY  1: Negative.

## 2020-05-16 DIAGNOSIS — B078 Other viral warts: Secondary | ICD-10-CM | POA: Diagnosis not present

## 2020-05-16 DIAGNOSIS — L821 Other seborrheic keratosis: Secondary | ICD-10-CM | POA: Diagnosis not present

## 2020-05-22 DIAGNOSIS — M79641 Pain in right hand: Secondary | ICD-10-CM | POA: Diagnosis not present

## 2020-05-29 ENCOUNTER — Ambulatory Visit (HOSPITAL_COMMUNITY): Payer: Medicare Other | Attending: Cardiology

## 2020-05-29 ENCOUNTER — Other Ambulatory Visit: Payer: Self-pay

## 2020-05-29 DIAGNOSIS — R0600 Dyspnea, unspecified: Secondary | ICD-10-CM | POA: Insufficient documentation

## 2020-05-29 DIAGNOSIS — R0609 Other forms of dyspnea: Secondary | ICD-10-CM

## 2020-05-29 LAB — ECHOCARDIOGRAM COMPLETE
Area-P 1/2: 3.21 cm2
S' Lateral: 2.3 cm

## 2020-06-11 ENCOUNTER — Ambulatory Visit: Payer: Medicare Other | Admitting: Physician Assistant

## 2020-06-17 DIAGNOSIS — R05 Cough: Secondary | ICD-10-CM | POA: Diagnosis not present

## 2020-06-20 ENCOUNTER — Telehealth: Payer: Self-pay | Admitting: Oncology

## 2020-06-20 ENCOUNTER — Other Ambulatory Visit: Payer: Self-pay | Admitting: Oncology

## 2020-06-20 ENCOUNTER — Ambulatory Visit (HOSPITAL_COMMUNITY)
Admission: RE | Admit: 2020-06-20 | Discharge: 2020-06-20 | Disposition: A | Discharge status: Home / Self Care | Payer: Medicare Other | Source: Ambulatory Visit | Attending: Pulmonary Disease | Admitting: Pulmonary Disease

## 2020-06-20 DIAGNOSIS — U071 COVID-19: Secondary | ICD-10-CM | POA: Diagnosis present

## 2020-06-20 DIAGNOSIS — Z23 Encounter for immunization: Secondary | ICD-10-CM | POA: Diagnosis not present

## 2020-06-20 MED ORDER — SODIUM CHLORIDE 0.9 % IV SOLN: INTRAVENOUS | Status: DC | PRN

## 2020-06-20 MED ORDER — EPINEPHRINE 0.3 MG/0.3ML IJ SOAJ: 0.3000 mg | Freq: Once | INTRAMUSCULAR | Status: DC | PRN

## 2020-06-20 MED ORDER — METHYLPREDNISOLONE SODIUM SUCC 125 MG IJ SOLR: 125.0000 mg | Freq: Once | INTRAMUSCULAR | Status: DC | PRN

## 2020-06-20 MED ORDER — DIPHENHYDRAMINE HCL 50 MG/ML IJ SOLN: 50.0000 mg | Freq: Once | INTRAMUSCULAR | Status: DC | PRN

## 2020-06-20 MED ORDER — SODIUM CHLORIDE 0.9 % IV SOLN
1200.0000 mg | Freq: Once | INTRAVENOUS | Status: AC
  Administered 2020-06-20: 1200 mg via INTRAVENOUS
  Filled 2020-06-20: qty 10

## 2020-06-20 MED ORDER — ALBUTEROL SULFATE HFA 108 (90 BASE) MCG/ACT IN AERS: 2.0000 | INHALATION_SPRAY | Freq: Once | RESPIRATORY_TRACT | Status: DC | PRN

## 2020-06-20 MED ORDER — FAMOTIDINE IN NACL 20-0.9 MG/50ML-% IV SOLN: 20.0000 mg | Freq: Once | INTRAVENOUS | Status: DC | PRN

## 2020-06-20 NOTE — Telephone Encounter (Signed)
I connected by phone with  Victoria Peters at 11:50am to  discuss the potential use of an new treatment for mild to moderate COVID-19 viral infection in non-hospitalized patients.   This patient is a age/sex that meets the FDA criteria for Emergency Use Authorization of casirivimab\imdevimab.  Has a (+) direct SARS-CoV-2 viral test result 1. Has mild or moderate COVID-19  2. Is ? 75 years of age and weighs ? 40 kg 3. Is NOT hospitalized due to COVID-19 4. Is NOT requiring oxygen therapy or requiring an increase in baseline oxygen flow rate due to COVID-19 5. Is within 10 days of symptom onset 6. Has at least one of the high risk factor(s) for progression to severe COVID-19 and/or hospitalization as defined in EUA. ? Specific high risk criteria : Age   Symptom onset  06/13/20   I have spoken and communicated the following to the patient or parent/caregiver:   1. FDA has authorized the emergency use of casirivimab\imdevimab for the treatment of mild to moderate COVID-19 in adults and pediatric patients with positive results of direct SARS-CoV-2 viral testing who are 31 years of age and older weighing at least 40 kg, and who are at high risk for progressing to severe COVID-19 and/or hospitalization.   2. The significant known and potential risks and benefits of casirivimab\imdevimab, and the extent to which such potential risks and benefits are unknown.   3. Information on available alternative treatments and the risks and benefits of those alternatives, including clinical trials.   4. Patients treated with casirivimab\imdevimab should continue to self-isolate and use infection control measures (e.g., wear mask, isolate, social distance, avoid sharing personal items, clean and disinfect "high touch" surfaces, and frequent handwashing) according to CDC guidelines.    5. The patient or parent/caregiver has the option to accept or refuse casirivimab\imdevimab .   After reviewing this information  with the patient, The patient agreed to proceed with receiving casirivimab\imdevimab infusion and will be provided a copy of the Fact sheet prior to receiving the infusion.Rulon Abide, AGNP-C [phone] ([place])

## 2020-06-20 NOTE — Progress Notes (Signed)
  Diagnosis: COVID-19  Physician: Dr. Joya Gaskins   Procedure: [facility] Med: casirivimab\imdevimab infusion - Provided patient with casirivimab\imdevimab fact sheet for patients, parents and caregivers prior to infusion.  Complications: No immediate complications noted.  Discharge: Discharged home   [name] 06/20/2020

## 2020-06-20 NOTE — Discharge Instructions (Signed)

## 2020-06-22 DIAGNOSIS — U071 COVID-19: Secondary | ICD-10-CM | POA: Diagnosis not present

## 2020-07-30 ENCOUNTER — Ambulatory Visit: Payer: Medicare Other | Admitting: Podiatry

## 2020-08-06 ENCOUNTER — Ambulatory Visit (INDEPENDENT_AMBULATORY_CARE_PROVIDER_SITE_OTHER): Payer: Medicare Other | Admitting: Podiatry

## 2020-08-06 ENCOUNTER — Ambulatory Visit (INDEPENDENT_AMBULATORY_CARE_PROVIDER_SITE_OTHER): Payer: Medicare Other

## 2020-08-06 ENCOUNTER — Other Ambulatory Visit: Payer: Self-pay

## 2020-08-06 DIAGNOSIS — M722 Plantar fascial fibromatosis: Secondary | ICD-10-CM

## 2020-08-06 DIAGNOSIS — M7752 Other enthesopathy of left foot: Secondary | ICD-10-CM

## 2020-08-06 DIAGNOSIS — M79672 Pain in left foot: Secondary | ICD-10-CM

## 2020-08-06 DIAGNOSIS — M7750 Other enthesopathy of unspecified foot: Secondary | ICD-10-CM

## 2020-08-06 NOTE — Patient Instructions (Signed)
For instructions on how to put on your Plantar Fascial Brace, please visit [email or web address]  If was nice to meet you today. If you have any questions or any further concerns, please feel fee to give me a call. You can call our office at [phone] or please feel fee to send me a message through MyChart.    Plantar Fasciitis (Heel Spur Syndrome) with Rehab The plantar fascia is a fibrous, ligament-like, soft-tissue structure that spans the bottom of the foot. Plantar fasciitis is a condition that causes pain in the foot due to inflammation of the tissue. SYMPTOMS   Pain and tenderness on the underneath side of the foot.  Pain that worsens with standing or walking. CAUSES  Plantar fasciitis is caused by irritation and injury to the plantar fascia on the underneath side of the foot. Common mechanisms of injury include:  Direct trauma to bottom of the foot.  Damage to a small nerve that runs under the foot where the main fascia attaches to the heel bone.  Stress placed on the plantar fascia due to bone spurs. RISK INCREASES WITH:   Activities that place stress on the plantar fascia (running, jumping, pivoting, or cutting).  Poor strength and flexibility.  Improperly fitted shoes.  Tight calf muscles.  Flat feet.  Failure to warm-up properly before activity.  Obesity. PREVENTION  Warm up and stretch properly before activity.  Allow for adequate recovery between workouts.  Maintain physical fitness:  Strength, flexibility, and endurance.  Cardiovascular fitness.  Maintain a health body weight.  Avoid stress on the plantar fascia.  Wear properly fitted shoes, including arch supports for individuals who have flat feet.  PROGNOSIS  If treated properly, then the symptoms of plantar fasciitis usually resolve without surgery. However, occasionally surgery is necessary.  RELATED COMPLICATIONS   Recurrent symptoms that may result in a chronic  condition.  Problems of the lower back that are caused by compensating for the injury, such as limping.  Pain or weakness of the foot during push-off following surgery.  Chronic inflammation, scarring, and partial or complete fascia tear, occurring more often from repeated injections.  TREATMENT  Treatment initially involves the use of ice and medication to help reduce pain and inflammation. The use of strengthening and stretching exercises may help reduce pain with activity, especially stretches of the Achilles tendon. These exercises may be performed at home or with a therapist. Your caregiver may recommend that you use heel cups of arch supports to help reduce stress on the plantar fascia. Occasionally, corticosteroid injections are given to reduce inflammation. If symptoms persist for greater than 6 months despite non-surgical (conservative), then surgery may be recommended.   MEDICATION   If pain medication is necessary, then nonsteroidal anti-inflammatory medications, such as aspirin and ibuprofen, or other minor pain relievers, such as acetaminophen, are often recommended.  Do not take pain medication within 7 days before surgery.  Prescription pain relievers may be given if deemed necessary by your caregiver. Use only as directed and only as much as you need.  Corticosteroid injections may be given by your caregiver. These injections should be reserved for the most serious cases, because they may only be given a certain number of times.  HEAT AND COLD  Cold treatment (icing) relieves pain and reduces inflammation. Cold treatment should be applied for 10 to 15 minutes every 2 to 3 hours for inflammation and pain and immediately after any activity that aggravates your symptoms. Use ice packs or massage the area with   a piece of ice (ice massage).  Heat treatment may be used prior to performing the stretching and strengthening activities prescribed by your caregiver, physical therapist,  or athletic trainer. Use a heat pack or soak the injury in warm water.  SEEK IMMEDIATE MEDICAL CARE IF:  Treatment seems to offer no benefit, or the condition worsens.  Any medications produce adverse side effects.  EXERCISES- RANGE OF MOTION (ROM) AND STRETCHING EXERCISES - Plantar Fasciitis (Heel Spur Syndrome) These exercises may help you when beginning to rehabilitate your injury. Your symptoms may resolve with or without further involvement from your physician, physical therapist or athletic trainer. While completing these exercises, remember:   Restoring tissue flexibility helps normal motion to return to the joints. This allows healthier, less painful movement and activity.  An effective stretch should be held for at least 30 seconds.  A stretch should never be painful. You should only feel a gentle lengthening or release in the stretched tissue.  RANGE OF MOTION - Toe Extension, Flexion  Sit with your right / left leg crossed over your opposite knee.  Grasp your toes and gently pull them back toward the top of your foot. You should feel a stretch on the bottom of your toes and/or foot.  Hold this stretch for 10 seconds.  Now, gently pull your toes toward the bottom of your foot. You should feel a stretch on the top of your toes and or foot.  Hold this stretch for 10 seconds. Repeat  times. Complete this stretch 3 times per day.   RANGE OF MOTION - Ankle Dorsiflexion, Active Assisted  Remove shoes and sit on a chair that is preferably not on a carpeted surface.  Place right / left foot under knee. Extend your opposite leg for support.  Keeping your heel down, slide your right / left foot back toward the chair until you feel a stretch at your ankle or calf. If you do not feel a stretch, slide your bottom forward to the edge of the chair, while still keeping your heel down.  Hold this stretch for 10 seconds. Repeat 3 times. Complete this stretch 2 times per day.    STRETCH  Gastroc, Standing  Place hands on wall.  Extend right / left leg, keeping the front knee somewhat bent.  Slightly point your toes inward on your back foot.  Keeping your right / left heel on the floor and your knee straight, shift your weight toward the wall, not allowing your back to arch.  You should feel a gentle stretch in the right / left calf. Hold this position for 10 seconds. Repeat 3 times. Complete this stretch 2 times per day.  STRETCH  Soleus, Standing  Place hands on wall.  Extend right / left leg, keeping the other knee somewhat bent.  Slightly point your toes inward on your back foot.  Keep your right / left heel on the floor, bend your back knee, and slightly shift your weight over the back leg so that you feel a gentle stretch deep in your back calf.  Hold this position for 10 seconds. Repeat 3 times. Complete this stretch 2 times per day.  STRETCH  Gastrocsoleus, Standing  Note: This exercise can place a lot of stress on your foot and ankle. Please complete this exercise only if specifically instructed by your caregiver.   Place the ball of your right / left foot on a step, keeping your other foot firmly on the same step.  Hold on   to the wall or a rail for balance.  Slowly lift your other foot, allowing your body weight to press your heel down over the edge of the step.  You should feel a stretch in your right / left calf.  Hold this position for 10 seconds.  Repeat this exercise with a slight bend in your right / left knee. Repeat 3 times. Complete this stretch 2 times per day.   STRENGTHENING EXERCISES - Plantar Fasciitis (Heel Spur Syndrome)  These exercises may help you when beginning to rehabilitate your injury. They may resolve your symptoms with or without further involvement from your physician, physical therapist or athletic trainer. While completing these exercises, remember:   Muscles can gain both the endurance and the strength  needed for everyday activities through controlled exercises.  Complete these exercises as instructed by your physician, physical therapist or athletic trainer. Progress the resistance and repetitions only as guided.  STRENGTH - Towel Curls  Sit in a chair positioned on a non-carpeted surface.  Place your foot on a towel, keeping your heel on the floor.  Pull the towel toward your heel by only curling your toes. Keep your heel on the floor. Repeat 3 times. Complete this exercise 2 times per day.  STRENGTH - Ankle Inversion  Secure one end of a rubber exercise band/tubing to a fixed object (table, pole). Loop the other end around your foot just before your toes.  Place your fists between your knees. This will focus your strengthening at your ankle.  Slowly, pull your big toe up and in, making sure the band/tubing is positioned to resist the entire motion.  Hold this position for 10 seconds.  Have your muscles resist the band/tubing as it slowly pulls your foot back to the starting position. Repeat 3 times. Complete this exercises 2 times per day.  Document Released: 10/20/2005 Document Revised: 01/12/2012 Document Reviewed: 02/01/2009 [employer] Patient Information 2014 [employer].  

## 2020-08-08 ENCOUNTER — Telehealth: Payer: Self-pay | Admitting: Podiatry

## 2020-08-08 NOTE — Telephone Encounter (Signed)
Pt called and left message yesterday 10.5 asking if someone could send her a picture of the brace she got at her appt, she did not get the instructions when she left.   I returned call and gave the the information to go to our website per Dr Jacqualyn Posey and there are instructions on there. She said she got it on today and it is helping.

## 2020-08-14 NOTE — Progress Notes (Signed)
Subjective:   Patient ID: Victoria Peters, female   DOB: 74 y.o.   MRN: [id number]   HPI 74 year old female presents the office today for concerns of left heel pain which been ongoing now for several months.  She states that it hurts in the morning when she first gets up or in the middle the night when she first gets up.  Also is uncomfortable after being on her feet all day.  She denies any recent injury or trauma.  No recent treatment.  No radiating pain or weakness.  She has no other concerns today.   Review of Systems  All other systems reviewed and are negative.  Past Medical History:  Diagnosis Date   Carpal tunnel syndrome on both sides    Degenerative cervical disc    Diverticulosis 11/2003   Duodenal diverticulum    Esophageal stricture    External hemorrhoids    Fatty tumor    GERD (gastroesophageal reflux disease)    Helicobacter pylori gastritis    Hiatal hernia    Hyperlipemia    Hypothyroid 49   IBS (irritable bowel syndrome)    Renal cyst     Past Surgical History:  Procedure Laterality Date   CARPAL TUNNEL RELEASE Bilateral    DILATION AND CURETTAGE OF UTERUS     fatty tumor removal of right arm Right    LAPAROSCOPY  1981   NOSE SURGERY     Deviated Septum      Current Outpatient Medications:    acetaminophen (TYLENOL) 500 MG tablet, Take 500 mg by mouth every 6 (six) hours as needed for mild pain or fever., Disp: , Rfl:    chlorhexidine (PERIDEX) 0.12 % solution, as needed. , Disp: , Rfl:    diclofenac sodium (VOLTAREN) 1 % GEL, APP 4 GRAMS EXT AA QID, Disp: , Rfl:    hyoscyamine (LEVSIN SL) 0.125 MG SL tablet, Take one tablet sl every 4-6 hours as needed for Abdominal pain and cramping, Disp: 30 tablet, Rfl: 1   levothyroxine (SYNTHROID, LEVOTHROID) 75 MCG tablet, Take 1 tablet (75 mcg total) by mouth daily before breakfast., Disp: , Rfl:    ondansetron (ZOFRAN) 8 MG tablet, Take 8 mg by mouth every 6 (six) hours as needed.,  Disp: , Rfl:    pantoprazole (PROTONIX) 40 MG tablet, Take 40 mg by mouth every morning., Disp: , Rfl:    pravastatin (PRAVACHOL) 20 MG tablet, Take 20 mg by mouth 3 (three) times a week., Disp: , Rfl:    predniSONE (DELTASONE) 10 MG tablet, Take by mouth., Disp: , Rfl:    rosuvastatin (CRESTOR) 10 MG tablet, Take by mouth., Disp: , Rfl:   Allergies  Allergen Reactions   Codeine Nausea Only         Objective:  Physical Exam  General: AAO x3, NAD  Dermatological: Skin is warm, dry and supple bilateral. There are no open sores, no preulcerative lesions, no rash or signs of infection present.  Vascular: Dorsalis Pedis artery and Posterior Tibial artery pedal pulses are 2/4 bilateral with immedate capillary fill time.  There is no pain with calf compression, swelling, warmth, erythema.   Neruologic: Grossly intact via light touch bilateral.  Negative Tinel sign.  Musculoskeletal: Tenderness to palpation along the plantar medial tubercle of the calcaneus at the insertion of plantar fascia on the left foot. There is no pain along the course of the plantar fascia within the arch of the foot. Plantar fascia appears to be intact. There is  no pain with lateral compression of the calcaneus or pain with vibratory sensation. There is no pain along the course or insertion of the achilles tendon. No other areas of tenderness to bilateral lower extremities.  Muscular strength 5/5 in all groups tested bilateral.  Gait: Unassisted, Nonantalgic.       Assessment:   Left heel pain, plan fasciitis    Plan:  -Treatment options discussed including all alternatives, risks, and complications -Etiology of symptoms were discussed -X-rays were obtained and reviewed with the patient.  There is no evidence of acute fracture or stress fracture identified today. -Steroid injection performed.  See procedure note below. -Plantar fascial brace dispensed -Discussed stretching, icing daily. -Discussed shoe  modifications and orthotics -Hold oral anti-inflammatories due to history of GI ulcer- can use voltaren gel  Procedure: Injection Tendon/Ligament Discussed alternatives, risks, complications and verbal consent was obtained.  Location: Left plantar fascia at the glabrous junction; medial approach. Skin Prep: Alcohol  Injectate: 0.5cc 0.5% marcaine plain, 0.5 cc 2% lidocaine plain and, 1 cc kenalog 10. Disposition: Patient tolerated procedure well. Injection site dressed with a band-aid.  Post-injection care was discussed and return precautions discussed.   Return in about 4 weeks (around 09/03/2020).  Trula Slade DPM

## 2020-08-23 DIAGNOSIS — E7800 Pure hypercholesterolemia, unspecified: Secondary | ICD-10-CM | POA: Diagnosis not present

## 2020-08-23 DIAGNOSIS — E039 Hypothyroidism, unspecified: Secondary | ICD-10-CM | POA: Diagnosis not present

## 2020-08-23 DIAGNOSIS — E559 Vitamin D deficiency, unspecified: Secondary | ICD-10-CM | POA: Diagnosis not present

## 2020-08-23 DIAGNOSIS — Z0000 Encounter for general adult medical examination without abnormal findings: Secondary | ICD-10-CM | POA: Diagnosis not present

## 2020-08-24 ENCOUNTER — Other Ambulatory Visit: Payer: Self-pay | Admitting: Obstetrics & Gynecology

## 2020-08-24 DIAGNOSIS — Z1231 Encounter for screening mammogram for malignant neoplasm of breast: Secondary | ICD-10-CM

## 2020-08-29 DIAGNOSIS — Z0000 Encounter for general adult medical examination without abnormal findings: Secondary | ICD-10-CM | POA: Diagnosis not present

## 2020-08-29 DIAGNOSIS — K219 Gastro-esophageal reflux disease without esophagitis: Secondary | ICD-10-CM | POA: Diagnosis not present

## 2020-08-29 DIAGNOSIS — E7800 Pure hypercholesterolemia, unspecified: Secondary | ICD-10-CM | POA: Diagnosis not present

## 2020-08-29 DIAGNOSIS — E673 Hypervitaminosis D: Secondary | ICD-10-CM | POA: Diagnosis not present

## 2020-08-29 DIAGNOSIS — E039 Hypothyroidism, unspecified: Secondary | ICD-10-CM | POA: Diagnosis not present

## 2020-09-12 DIAGNOSIS — D225 Melanocytic nevi of trunk: Secondary | ICD-10-CM | POA: Diagnosis not present

## 2020-09-12 DIAGNOSIS — D2261 Melanocytic nevi of right upper limb, including shoulder: Secondary | ICD-10-CM | POA: Diagnosis not present

## 2020-09-12 DIAGNOSIS — L918 Other hypertrophic disorders of the skin: Secondary | ICD-10-CM | POA: Diagnosis not present

## 2020-09-12 DIAGNOSIS — L814 Other melanin hyperpigmentation: Secondary | ICD-10-CM | POA: Diagnosis not present

## 2020-09-12 DIAGNOSIS — D1801 Hemangioma of skin and subcutaneous tissue: Secondary | ICD-10-CM | POA: Diagnosis not present

## 2020-09-12 DIAGNOSIS — B078 Other viral warts: Secondary | ICD-10-CM | POA: Diagnosis not present

## 2020-09-26 ENCOUNTER — Ambulatory Visit: Payer: Medicare Other

## 2020-10-09 DIAGNOSIS — K219 Gastro-esophageal reflux disease without esophagitis: Secondary | ICD-10-CM | POA: Diagnosis not present

## 2020-10-09 DIAGNOSIS — K5900 Constipation, unspecified: Secondary | ICD-10-CM | POA: Diagnosis not present

## 2020-10-09 DIAGNOSIS — K642 Third degree hemorrhoids: Secondary | ICD-10-CM | POA: Diagnosis not present

## 2020-10-09 DIAGNOSIS — K625 Hemorrhage of anus and rectum: Secondary | ICD-10-CM | POA: Diagnosis not present

## 2020-11-01 ENCOUNTER — Other Ambulatory Visit: Payer: Self-pay

## 2020-11-01 ENCOUNTER — Ambulatory Visit
Admission: EM | Admit: 2020-11-01 | Discharge: 2020-11-01 | Disposition: A | Discharge status: Home / Self Care | Payer: Medicare Other | Attending: Internal Medicine | Admitting: Internal Medicine

## 2020-11-01 DIAGNOSIS — J3089 Other allergic rhinitis: Secondary | ICD-10-CM

## 2020-11-01 DIAGNOSIS — Z7689 Persons encountering health services in other specified circumstances: Secondary | ICD-10-CM | POA: Diagnosis not present

## 2020-11-01 MED ORDER — GUAIFENESIN ER 600 MG PO TB12: 600.0000 mg | ORAL_TABLET | Freq: Two times a day (BID) | ORAL | 0 refills | Status: AC

## 2020-11-01 MED ORDER — CETIRIZINE HCL 10 MG PO TABS: 10.0000 mg | ORAL_TABLET | Freq: Every day | ORAL | 0 refills | Status: AC

## 2020-11-01 MED ORDER — FLUTICASONE PROPIONATE 50 MCG/ACT NA SUSP: 1.0000 | Freq: Every day | NASAL | 0 refills | Status: AC

## 2020-11-01 NOTE — ED Provider Notes (Signed)
[facility]    CSN: [id number] Arrival date & time: 11/01/20  1207      History   Chief Complaint Chief Complaint  Patient presents with  . URI    HPI Victoria Peters is a 74 y.o. female urgent care with a 3-day history of postnasal drainage, nonproductive cough, sinus pressure and nasal congestion.  Says symptoms have been medications.  She denies any shortness of breath or  chills.  She has had subjective fever.  No nausea, vomiting or abdominal pain   HPI  Past Medical History:  Diagnosis Date  . Carpal tunnel syndrome on both sides   . Degenerative cervical disc   . Diverticulosis 11/2003  . Duodenal diverticulum   . Esophageal stricture   . External hemorrhoids   . Fatty tumor   . GERD (gastroesophageal reflux disease)   . Helicobacter pylori gastritis   . Hiatal hernia   . Hyperlipemia   . Hypothyroid 49  . IBS (irritable bowel syndrome)   . Renal cyst     Patient Active Problem List   Diagnosis Date Noted  . SIRS (systemic inflammatory response syndrome) (HCC) 10/25/2017  . Muscle ache 04/06/2016  . Atypical chest pain 07/26/2014  . Hyperlipidemia 07/26/2014  . Hypothyroid   . High cholesterol     Past Surgical History:  Procedure Laterality Date  . CARPAL TUNNEL RELEASE Bilateral   . DILATION AND CURETTAGE OF UTERUS    . fatty tumor removal of right arm Right   . LAPAROSCOPY  1981  . NOSE SURGERY     Deviated Septum     OB History    Gravida  3   Para  2   Term  2   Preterm  0   AB  1   Living  2     SAB  1   IAB  0   Ectopic  0   Multiple  0   Live Births  2            Home Medications    Prior to Admission medications   Medication Sig Start Date End Date Taking? Authorizing Provider  cetirizine (ZYRTEC ALLERGY) 10 MG tablet Take 1 tablet (10 mg total) by mouth daily. 11/01/20  Yes Amyra Vantuyl, Britta Mccreedy, MD  fluticasone (FLONASE) 50 MCG/ACT nasal spray Place 1 spray into both nostrils daily. 11/01/20  Yes  Khila Papp, Britta Mccreedy, MD  guaiFENesin (MUCINEX) 600 MG 12 hr tablet Take 1 tablet (600 mg total) by mouth 2 (two) times daily for 14 days. 11/01/20 11/15/20 Yes Everardo Voris, Britta Mccreedy, MD  acetaminophen (TYLENOL) 500 MG tablet Take 500 mg by mouth every 6 (six) hours as needed for mild pain or fever.    [provider]  chlorhexidine (PERIDEX) 0.12 % solution as needed.  11/10/18   [provider]  diclofenac sodium (VOLTAREN) 1 % GEL APP 4 GRAMS EXT AA QID 09/08/18   [provider]  hyoscyamine (LEVSIN SL) 0.125 MG SL tablet Take one tablet sl every 4-6 hours as needed for Abdominal pain and cramping 07/14/17   Jerene Bears, MD  levothyroxine (SYNTHROID, LEVOTHROID) 75 MCG tablet Take 1 tablet (75 mcg total) by mouth daily before breakfast. 12/06/18   Jerene Bears, MD  ondansetron (ZOFRAN) 8 MG tablet Take 8 mg by mouth every 6 (six) hours as needed. 06/18/20   [provider]  pantoprazole (PROTONIX) 40 MG tablet Take 40 mg by mouth every morning. 03/28/20  [provider]  pravastatin (PRAVACHOL) 20 MG tablet Take 20 mg by mouth 3 (three) times a week. 04/25/20   [provider]  predniSONE (DELTASONE) 10 MG tablet Take by mouth. 05/22/20   [provider]  rosuvastatin (CRESTOR) 10 MG tablet Take by mouth.    [provider]    Family History Family History  Problem Relation Age of Onset  . Heart disease Brother   . Lung cancer Father   . Osteoporosis Mother   . Colon polyps Brother   . Colon cancer Other        grandmother???  . COPD Other   . Osteoporosis Sister   . CVA Maternal Grandmother   . CVA Maternal Grandfather   . CVA Paternal Grandmother   . Heart attack Paternal Grandfather 47    Social History Social History   Tobacco Use  . Smoking status: Former Smoker    Packs/day: 1.00    Years: 7.00    Pack years: 7.00    Quit date: 11/03/1966    Years since quitting: 54.0  . Smokeless tobacco: Never Used  Vaping  Use  . Vaping Use: Never used  Substance Use Topics  . Alcohol use: No  . Drug use: No     Allergies   Codeine   Review of Systems Review of Systems  Constitutional: Negative for chills, fatigue and fever.  HENT: Positive for congestion. Negative for rhinorrhea and sore throat.   Respiratory: Positive for cough. Negative for shortness of breath and wheezing.   Cardiovascular: Negative for chest pain.  Neurological: Negative for dizziness, weakness, light-headedness and headaches.     Physical Exam Triage Vital Signs ED Triage Vitals  Enc Vitals Group     BP 11/01/20 1554 140/83     Pulse Rate 11/01/20 1554 83     Resp 11/01/20 1554 17     Temp 11/01/20 1554 97.9 F (36.6 C)     Temp Source 11/01/20 1554 Oral     SpO2 11/01/20 1554 97 %     Weight --      Height --      Head Circumference --      Peak Flow --      Pain Score 11/01/20 1555 4     Pain Loc --      Pain Edu? --      Excl. in [place]? --    No data found.  Updated Vital Signs BP 140/83 (BP Location: Left Arm)   Pulse 83   Temp 97.9 F (36.6 C) (Oral)   Resp 17   LMP 11/03/2000 (Approximate)   SpO2 97%   Visual Acuity Right Eye Distance:   Left Eye Distance:   Bilateral Distance:    Right Eye Near:   Left Eye Near:    Bilateral Near:     Physical Exam   UC Treatments / Results  Labs (all labs ordered are listed, but only abnormal results are displayed) Labs Reviewed  NOVEL CORONAVIRUS, NAA    EKG   Radiology No results found.  Procedures Procedures (including critical care time)  Medications Ordered in UC Medications - No data to display  Initial Impression / Assessment and Plan / UC Course  I have reviewed the triage vital signs and the nursing notes.  Pertinent labs & imaging results that were available during my care of the patient were reviewed by me and considered in my medical decision making (see chart for details).  1. Acute sinusitis likely secondary to  weather changes Saline nasal spray Flonase nasal spray,zyrtec and mucinex covid-19 PCR test sent Increase oral fluid intake. These quarantine until COVID-19 test results are available Return to urgent care if you have worsening symptoms We will call you if your results are normal Final Clinical Impressions(s) / UC Diagnoses   Final diagnoses:  Non-seasonal allergic rhinitis, unspecified trigger     Discharge Instructions     Warm salt water gargle Quarantine until covid-19 test is available Return to urgent care for worsening symptoms We will call you with recommendations if your results are abnormal.   ED Prescriptions    Medication Sig Dispense Auth. Provider   fluticasone (FLONASE) 50 MCG/ACT nasal spray Place 1 spray into both nostrils daily. 16 g Merrilee Jansky, MD   cetirizine (ZYRTEC ALLERGY) 10 MG tablet Take 1 tablet (10 mg total) by mouth daily. 30 tablet Kenyetta Fife, Britta Mccreedy, MD   guaiFENesin (MUCINEX) 600 MG 12 hr tablet Take 1 tablet (600 mg total) by mouth 2 (two) times daily for 14 days. 28 tablet Danyela Posas, Britta Mccreedy, MD     PDMP not reviewed this encounter.   Merrilee Jansky, MD 11/01/20 [phone]

## 2020-11-01 NOTE — Discharge Instructions (Signed)
Warm salt water gargle Quarantine until covid-19 test is available Return to urgent care for worsening symptoms We will call you with recommendations if your results are abnormal.

## 2020-11-01 NOTE — ED Triage Notes (Signed)
Pt presents with non productive cough, post nasal drainage, sinus pressure, and congestion X 3 days.

## 2020-11-03 LAB — SARS-COV-2, NAA 2 DAY TAT

## 2020-11-03 LAB — NOVEL CORONAVIRUS, NAA: SARS-CoV-2, NAA: DETECTED — AB

## 2020-11-04 ENCOUNTER — Telehealth: Payer: Self-pay | Admitting: Physician Assistant

## 2020-11-04 NOTE — Telephone Encounter (Signed)
Called to discuss with patient about Covid symptoms and the use of a monoclonal antibody infusion for those with mild to moderate Covid symptoms and at a high risk of hospitalization.   Pt is qualified for the monoclonal antibody infusion, but due to medication shortages we cannot offer the pt the infusion at this time. This decision was based on clinical judgement as well as the the NIH Covid 19 treatment guidelines for treatment prioritization and equitable access. Symptoms tier reviewed as well as criteria for ending isolation. Preventative practices reviewed. Patient verbalized understanding.  Pt unvaccinated but doing much better. At 1 week of symptoms.   Cline Crock, PA-C  11/04/2020 1:16 PM

## 2020-11-07 ENCOUNTER — Ambulatory Visit: Payer: Medicare Other

## 2020-11-12 DIAGNOSIS — Z1152 Encounter for screening for COVID-19: Secondary | ICD-10-CM | POA: Diagnosis not present

## 2020-11-12 DIAGNOSIS — E673 Hypervitaminosis D: Secondary | ICD-10-CM | POA: Diagnosis not present

## 2020-11-12 DIAGNOSIS — E559 Vitamin D deficiency, unspecified: Secondary | ICD-10-CM | POA: Diagnosis not present

## 2020-11-12 DIAGNOSIS — Z03818 Encounter for observation for suspected exposure to other biological agents ruled out: Secondary | ICD-10-CM | POA: Diagnosis not present

## 2020-11-12 DIAGNOSIS — E039 Hypothyroidism, unspecified: Secondary | ICD-10-CM | POA: Diagnosis not present

## 2020-12-10 DIAGNOSIS — H04123 Dry eye syndrome of bilateral lacrimal glands: Secondary | ICD-10-CM | POA: Diagnosis not present

## 2020-12-10 DIAGNOSIS — H43812 Vitreous degeneration, left eye: Secondary | ICD-10-CM | POA: Diagnosis not present

## 2020-12-10 DIAGNOSIS — Z961 Presence of intraocular lens: Secondary | ICD-10-CM | POA: Diagnosis not present

## 2020-12-10 DIAGNOSIS — H02831 Dermatochalasis of right upper eyelid: Secondary | ICD-10-CM | POA: Diagnosis not present

## 2020-12-12 ENCOUNTER — Ambulatory Visit: Payer: Medicare Other

## 2021-01-08 DIAGNOSIS — M8580 Other specified disorders of bone density and structure, unspecified site: Secondary | ICD-10-CM | POA: Diagnosis not present

## 2021-01-08 DIAGNOSIS — Z7689 Persons encountering health services in other specified circumstances: Secondary | ICD-10-CM | POA: Diagnosis not present

## 2021-01-08 DIAGNOSIS — E7800 Pure hypercholesterolemia, unspecified: Secondary | ICD-10-CM | POA: Diagnosis not present

## 2021-01-08 DIAGNOSIS — E039 Hypothyroidism, unspecified: Secondary | ICD-10-CM | POA: Diagnosis not present

## 2021-01-25 ENCOUNTER — Ambulatory Visit: Payer: Medicare Other

## 2021-03-15 ENCOUNTER — Ambulatory Visit
Admission: RE | Admit: 2021-03-15 | Discharge: 2021-03-15 | Disposition: A | Discharge status: Home / Self Care | Payer: Medicare Other | Source: Ambulatory Visit | Attending: Obstetrics & Gynecology | Admitting: Obstetrics & Gynecology

## 2021-03-15 ENCOUNTER — Ambulatory Visit: Payer: Medicare Other

## 2021-03-15 ENCOUNTER — Other Ambulatory Visit: Payer: Self-pay

## 2021-03-15 DIAGNOSIS — Z1231 Encounter for screening mammogram for malignant neoplasm of breast: Secondary | ICD-10-CM | POA: Diagnosis not present

## 2021-03-27 ENCOUNTER — Encounter: Payer: Self-pay | Admitting: Podiatry

## 2021-03-27 ENCOUNTER — Ambulatory Visit: Payer: Medicare Other | Admitting: Podiatry

## 2021-03-27 ENCOUNTER — Other Ambulatory Visit: Payer: Self-pay

## 2021-03-27 ENCOUNTER — Ambulatory Visit (INDEPENDENT_AMBULATORY_CARE_PROVIDER_SITE_OTHER): Payer: Medicare Other

## 2021-03-27 DIAGNOSIS — M722 Plantar fascial fibromatosis: Secondary | ICD-10-CM

## 2021-03-27 MED ORDER — TRIAMCINOLONE ACETONIDE 10 MG/ML IJ SUSP
10.0000 mg | Freq: Once | INTRAMUSCULAR | Status: AC
  Administered 2021-03-27: 10 mg

## 2021-03-27 NOTE — Progress Notes (Signed)
Subjective:   Patient ID: Victoria Peters, female   DOB: 75 y.o.   MRN: [id number]   HPI Patient states that her left heel has been very severely tender and its been hard for her to walk on.  States she only had 1 to 2 months of relief from the previous treatment and states that it really is reaching a point where it is quite debilitating for her to be ambulatory   ROS      Objective:  Physical Exam  Neurovascular status intact with exquisite discomfort in the plantar fascial mostly medial and central band left with fluid buildup within the tendon itself     Assessment:  Acute plantar fasciitis left with inflammation fluid     Plan:  H&P due to the intensity as I did go ahead today and applied air fracture walker with instructions on usage and I did sterile prep and injected the plantar fascia 3 mg Dexasone Kenalog 5 mg Xylocaine and x-rays to rule out stress fracture  X-rays indicate no signs of stress fracture with spur formation plantar heel no other pathology noted

## 2021-04-08 DIAGNOSIS — M7918 Myalgia, other site: Secondary | ICD-10-CM | POA: Diagnosis not present

## 2021-04-08 DIAGNOSIS — Z79899 Other long term (current) drug therapy: Secondary | ICD-10-CM | POA: Diagnosis not present

## 2021-04-08 DIAGNOSIS — M8580 Other specified disorders of bone density and structure, unspecified site: Secondary | ICD-10-CM | POA: Diagnosis not present

## 2021-04-08 DIAGNOSIS — E039 Hypothyroidism, unspecified: Secondary | ICD-10-CM | POA: Diagnosis not present

## 2021-04-08 DIAGNOSIS — Z0001 Encounter for general adult medical examination with abnormal findings: Secondary | ICD-10-CM | POA: Diagnosis not present

## 2021-04-08 DIAGNOSIS — E7800 Pure hypercholesterolemia, unspecified: Secondary | ICD-10-CM | POA: Diagnosis not present

## 2021-04-08 DIAGNOSIS — T466X5D Adverse effect of antihyperlipidemic and antiarteriosclerotic drugs, subsequent encounter: Secondary | ICD-10-CM | POA: Diagnosis not present

## 2021-04-08 DIAGNOSIS — E559 Vitamin D deficiency, unspecified: Secondary | ICD-10-CM | POA: Diagnosis not present

## 2021-04-11 DIAGNOSIS — Z0000 Encounter for general adult medical examination without abnormal findings: Secondary | ICD-10-CM | POA: Diagnosis not present

## 2021-04-11 DIAGNOSIS — E559 Vitamin D deficiency, unspecified: Secondary | ICD-10-CM | POA: Diagnosis not present

## 2021-04-11 DIAGNOSIS — E7800 Pure hypercholesterolemia, unspecified: Secondary | ICD-10-CM | POA: Diagnosis not present

## 2021-04-11 DIAGNOSIS — E039 Hypothyroidism, unspecified: Secondary | ICD-10-CM | POA: Diagnosis not present

## 2021-04-11 DIAGNOSIS — Z789 Other specified health status: Secondary | ICD-10-CM | POA: Diagnosis not present

## 2021-04-17 ENCOUNTER — Ambulatory Visit (INDEPENDENT_AMBULATORY_CARE_PROVIDER_SITE_OTHER): Payer: Medicare Other | Admitting: Podiatry

## 2021-04-17 ENCOUNTER — Encounter: Payer: Self-pay | Admitting: Podiatry

## 2021-04-17 ENCOUNTER — Other Ambulatory Visit: Payer: Self-pay

## 2021-04-17 DIAGNOSIS — M722 Plantar fascial fibromatosis: Secondary | ICD-10-CM | POA: Diagnosis not present

## 2021-04-17 NOTE — Progress Notes (Signed)
Subjective:   Patient ID: Victoria Peters, female   DOB: 75 y.o.   MRN: [id number]   HPI Patient presents stating she is still having severe discomfort in the bottom of her left heel both the medial central and lateral band area and states that the boot has not really helped her up to this point   ROS      Objective:  Physical Exam  Neurovascular status intact with exquisite discomfort in the center lateral and medial aspect plantar fascial left     Assessment:  Continued acute Planter fasciitis that has not responded up to this time     Plan:  H&P reviewed all conditions concerning this and at this point I do think that either shockwave or open cutting surgery would be the best chance for her long-term.  I reviewed the pros and cons of both of these and she has decided on shockwave and is scheduled to have this done and I did review again it still could require open surgery when it is all set and

## 2021-04-29 ENCOUNTER — Other Ambulatory Visit: Payer: Medicare Other

## 2021-05-13 ENCOUNTER — Other Ambulatory Visit: Payer: Medicare Other

## 2021-05-24 ENCOUNTER — Other Ambulatory Visit: Payer: Medicare Other

## 2021-06-10 ENCOUNTER — Ambulatory Visit: Payer: Medicare Other

## 2021-06-21 ENCOUNTER — Other Ambulatory Visit: Payer: Medicare Other

## 2021-09-19 ENCOUNTER — Other Ambulatory Visit: Payer: Self-pay

## 2021-09-19 ENCOUNTER — Encounter (HOSPITAL_BASED_OUTPATIENT_CLINIC_OR_DEPARTMENT_OTHER): Payer: Self-pay | Admitting: Obstetrics & Gynecology

## 2021-09-19 ENCOUNTER — Ambulatory Visit (HOSPITAL_BASED_OUTPATIENT_CLINIC_OR_DEPARTMENT_OTHER): Payer: Medicare Other | Admitting: Obstetrics & Gynecology

## 2021-09-19 VITALS — BP 151/84 | HR 86 | Ht 61.5 in | Wt 141.6 lb

## 2021-09-19 DIAGNOSIS — N644 Mastodynia: Secondary | ICD-10-CM

## 2021-09-19 DIAGNOSIS — M25511 Pain in right shoulder: Secondary | ICD-10-CM

## 2021-09-19 NOTE — Progress Notes (Signed)
GYNECOLOGY  VISIT  CC:   right breast pain  HPI: 75 y.o. G65P2012 Married White or Caucasian female here for complaint of right breast pain.  Reports she's had issues on and off with her right breast for years.  She feels like it hurts all the time but there is also shoulder and right arm pain.  She has hx of right upper arm lipoma removal (years ago) and this area aches as well.  She is taking some tylenol to help and she does feel like it helps.  No recent trauma.  No issues with arm movement.  No nipple discharge.  Last MMG was 03/15/2021.  She's never had a breast biopsy.  No family history of breast cancer.    GYNECOLOGIC HISTORY: Patient's last menstrual period was 11/03/2000 (approximate).  Patient Active Problem List   Diagnosis Date Noted   SIRS (systemic inflammatory response syndrome) ([place]) 10/25/2017   Muscle ache 04/06/2016   Atypical chest pain 07/26/2014   Hyperlipidemia 07/26/2014   Hypothyroid    High cholesterol     Past Medical History:  Diagnosis Date   Carpal tunnel syndrome on both sides    Degenerative cervical disc    Diverticulosis 11/2003   Duodenal diverticulum    Esophageal stricture    External hemorrhoids    Fatty tumor    GERD (gastroesophageal reflux disease)    Helicobacter pylori gastritis    Hiatal hernia    Hyperlipemia    Hypothyroid 49   IBS (irritable bowel syndrome)    Renal cyst     Past Surgical History:  Procedure Laterality Date   CARPAL TUNNEL RELEASE Bilateral    DILATION AND CURETTAGE OF UTERUS     fatty tumor removal of right arm Right    LAPAROSCOPY  1981   NOSE SURGERY     Deviated Septum     MEDS:   Current Outpatient Medications on File Prior to Visit  Medication Sig Dispense Refill   acetaminophen (TYLENOL) 500 MG tablet Take 500 mg by mouth every 6 (six) hours as needed for mild pain or fever.     cetirizine (ZYRTEC ALLERGY) 10 MG tablet Take 1 tablet (10 mg total) by mouth daily. 30 tablet 0   chlorhexidine  (PERIDEX) 0.12 % solution as needed.      diclofenac sodium (VOLTAREN) 1 % GEL APP 4 GRAMS EXT AA QID     fluticasone (FLONASE) 50 MCG/ACT nasal spray Place 1 spray into both nostrils daily. 16 g 0   levothyroxine (SYNTHROID, LEVOTHROID) 75 MCG tablet Take 1 tablet (75 mcg total) by mouth daily before breakfast.     ondansetron (ZOFRAN) 8 MG tablet Take 8 mg by mouth every 6 (six) hours as needed.     pantoprazole (PROTONIX) 40 MG tablet Take 40 mg by mouth every morning.     hyoscyamine (LEVSIN SL) 0.125 MG SL tablet Take one tablet sl every 4-6 hours as needed for Abdominal pain and cramping (Patient not taking: Reported on 09/19/2021) 30 tablet 1   pravastatin (PRAVACHOL) 20 MG tablet Take 20 mg by mouth 3 (three) times a week. (Patient not taking: Reported on 09/19/2021)     predniSONE (DELTASONE) 10 MG tablet Take by mouth. (Patient not taking: Reported on 09/19/2021)     rosuvastatin (CRESTOR) 10 MG tablet Take by mouth. (Patient not taking: Reported on 09/19/2021)     traZODone (DESYREL) 50 MG tablet 1 tablet at bedtime as needed (Patient not taking: Reported on 09/19/2021)  No current facility-administered medications on file prior to visit.    ALLERGIES: Codeine  Family History  Problem Relation Age of Onset   Heart disease Brother    Lung cancer Father    Osteoporosis Mother    Colon polyps Brother    Colon cancer Other        grandmother???   COPD Other    Osteoporosis Sister    CVA Maternal Grandmother    CVA Maternal Grandfather    CVA Paternal Grandmother    Heart attack Paternal Grandfather 55    SH:  married, non smoker  Review of Systems  Constitutional: Negative.   Musculoskeletal:  Positive for joint pain (shoulder pain).   PHYSICAL EXAMINATION:    BP (!) 151/84 (BP Location: Left Arm, Patient Position: Sitting, Cuff Size: Normal)   Pulse 86   Ht 5' 1.5" (1.562 m) Comment: reported  Wt 141 lb 9.6 oz (64.2 kg)   LMP 11/03/2000 (Approximate)   BMI  26.32 kg/m     Physical Exam Constitutional:      Appearance: Normal appearance.  Chest:  Breasts:    Right: Tenderness (diffuse tenderness) present. No swelling, bleeding, inverted nipple, mass, nipple discharge or skin change.     Left: Normal. No swelling, bleeding, inverted nipple, mass, nipple discharge, skin change or tenderness.  Musculoskeletal:     Cervical back: Normal range of motion and neck supple.  Lymphadenopathy:     Upper Body:     Right upper body: No supraclavicular or axillary adenopathy.     Left upper body: No supraclavicular or axillary adenopathy.  Neurological:     General: No focal deficit present.     Mental Status: She is alert.    Assessment/Plan: 1. Breast pain, right - MM DIAG BREAST TOMO UNI RIGHT; Future - US BREAST LTD UNI RIGHT INC AXILLA; Future - Vit E OTC recommended as well to see if this will help with breast pain  2. Acute pain of right shoulder - pt aware this may be contributing to her pain so feel evaluation is warranted - AMB referral to sports medicine

## 2021-10-01 DIAGNOSIS — J0100 Acute maxillary sinusitis, unspecified: Secondary | ICD-10-CM | POA: Diagnosis not present

## 2021-10-08 DIAGNOSIS — J0100 Acute maxillary sinusitis, unspecified: Secondary | ICD-10-CM | POA: Diagnosis not present

## 2021-10-23 ENCOUNTER — Ambulatory Visit: Payer: Medicare Other

## 2021-10-23 ENCOUNTER — Ambulatory Visit
Admission: RE | Admit: 2021-10-23 | Discharge: 2021-10-23 | Disposition: A | Discharge status: Home / Self Care | Payer: Medicare Other | Source: Ambulatory Visit | Attending: Obstetrics & Gynecology | Admitting: Obstetrics & Gynecology

## 2021-10-23 DIAGNOSIS — N644 Mastodynia: Secondary | ICD-10-CM | POA: Diagnosis not present

## 2021-10-23 DIAGNOSIS — R922 Inconclusive mammogram: Secondary | ICD-10-CM | POA: Diagnosis not present

## 2021-10-30 ENCOUNTER — Other Ambulatory Visit: Payer: Medicare Other

## 2022-05-08 ENCOUNTER — Other Ambulatory Visit: Payer: Self-pay | Admitting: Obstetrics & Gynecology

## 2022-05-08 DIAGNOSIS — Z1231 Encounter for screening mammogram for malignant neoplasm of breast: Secondary | ICD-10-CM

## 2022-10-24 ENCOUNTER — Ambulatory Visit
Admission: RE | Admit: 2022-10-24 | Discharge: 2022-10-24 | Disposition: A | Discharge status: Home / Self Care | Payer: Medicare Other | Source: Ambulatory Visit | Attending: Obstetrics & Gynecology | Admitting: Obstetrics & Gynecology

## 2022-10-24 DIAGNOSIS — Z1231 Encounter for screening mammogram for malignant neoplasm of breast: Secondary | ICD-10-CM

## 2023-09-24 ENCOUNTER — Other Ambulatory Visit: Payer: Self-pay | Admitting: Internal Medicine

## 2023-09-24 DIAGNOSIS — Z1231 Encounter for screening mammogram for malignant neoplasm of breast: Secondary | ICD-10-CM

## 2023-10-30 ENCOUNTER — Ambulatory Visit
Admission: RE | Admit: 2023-10-30 | Discharge: 2023-10-30 | Disposition: A | Discharge status: Home / Self Care | Payer: Medicare Other | Source: Ambulatory Visit | Attending: Internal Medicine | Admitting: Internal Medicine

## 2023-10-30 DIAGNOSIS — Z1231 Encounter for screening mammogram for malignant neoplasm of breast: Secondary | ICD-10-CM

## 2024-09-22 ENCOUNTER — Other Ambulatory Visit: Payer: Self-pay | Admitting: Obstetrics & Gynecology

## 2024-09-22 DIAGNOSIS — Z1231 Encounter for screening mammogram for malignant neoplasm of breast: Secondary | ICD-10-CM

## 2024-10-31 ENCOUNTER — Ambulatory Visit
Admission: RE | Admit: 2024-10-31 | Discharge: 2024-10-31 | Disposition: A | Discharge status: Home / Self Care | Source: Ambulatory Visit | Attending: Obstetrics & Gynecology | Admitting: Obstetrics & Gynecology

## 2024-10-31 DIAGNOSIS — Z1231 Encounter for screening mammogram for malignant neoplasm of breast: Secondary | ICD-10-CM
